# Patient Record
Sex: Female | Born: 1955
Health system: Southern US, Community
[De-identification: ages and names within clinical notes are randomized; demographics above are authoritative.]

## PROBLEM LIST (undated history)

## (undated) DIAGNOSIS — H269 Unspecified cataract: Secondary | ICD-10-CM

## (undated) DIAGNOSIS — N189 Chronic kidney disease, unspecified: Secondary | ICD-10-CM

## (undated) DIAGNOSIS — T7840XA Allergy, unspecified, initial encounter: Secondary | ICD-10-CM

## (undated) DIAGNOSIS — I10 Essential (primary) hypertension: Secondary | ICD-10-CM

## (undated) DIAGNOSIS — D649 Anemia, unspecified: Secondary | ICD-10-CM

## (undated) DIAGNOSIS — E559 Vitamin D deficiency, unspecified: Secondary | ICD-10-CM

## (undated) DIAGNOSIS — M1990 Unspecified osteoarthritis, unspecified site: Secondary | ICD-10-CM

## (undated) DIAGNOSIS — M8580 Other specified disorders of bone density and structure, unspecified site: Secondary | ICD-10-CM

## (undated) DIAGNOSIS — N3090 Cystitis, unspecified without hematuria: Secondary | ICD-10-CM

## (undated) DIAGNOSIS — M810 Age-related osteoporosis without current pathological fracture: Secondary | ICD-10-CM

## (undated) DIAGNOSIS — G4700 Insomnia, unspecified: Secondary | ICD-10-CM

## (undated) HISTORY — DX: Unspecified cataract: H26.9

## (undated) HISTORY — DX: Allergy, unspecified, initial encounter: T78.40XA

## (undated) HISTORY — DX: Anemia, unspecified: D64.9

## (undated) HISTORY — DX: Insomnia, unspecified: G47.00

## (undated) HISTORY — DX: Other specified disorders of bone density and structure, unspecified site: M85.80

## (undated) HISTORY — PX: LITHOTRIPSY: SUR834

## (undated) HISTORY — PX: CATARACT EXTRACTION, BILATERAL: SHX1313

## (undated) HISTORY — DX: Age-related osteoporosis without current pathological fracture: M81.0

## (undated) HISTORY — DX: Chronic kidney disease, unspecified: N18.9

## (undated) HISTORY — DX: Unspecified osteoarthritis, unspecified site: M19.90

## (undated) HISTORY — DX: Cystitis, unspecified without hematuria: N30.90

## (undated) HISTORY — PX: COLONOSCOPY: SHX174

## (undated) HISTORY — DX: Essential (primary) hypertension: I10

## (undated) HISTORY — PX: TOE SURGERY: SHX1073

## (undated) HISTORY — DX: Vitamin D deficiency, unspecified: E55.9

---

## 1998-11-30 ENCOUNTER — Other Ambulatory Visit: Admission: RE | Admit: 1998-11-30 | Discharge: 1998-11-30 | Payer: Self-pay | Admitting: *Deleted

## 1999-12-21 ENCOUNTER — Other Ambulatory Visit: Admission: RE | Admit: 1999-12-21 | Discharge: 1999-12-21 | Payer: Self-pay | Admitting: *Deleted

## 2001-08-21 ENCOUNTER — Other Ambulatory Visit: Admission: RE | Admit: 2001-08-21 | Discharge: 2001-08-21 | Payer: Self-pay | Admitting: Obstetrics & Gynecology

## 2002-10-12 ENCOUNTER — Other Ambulatory Visit: Admission: RE | Admit: 2002-10-12 | Discharge: 2002-10-12 | Payer: Self-pay | Admitting: Obstetrics & Gynecology

## 2008-02-18 ENCOUNTER — Ambulatory Visit: Payer: Self-pay | Admitting: Vascular Surgery

## 2008-04-06 ENCOUNTER — Ambulatory Visit: Payer: Self-pay | Admitting: Vascular Surgery

## 2008-05-20 ENCOUNTER — Ambulatory Visit: Payer: Self-pay | Admitting: Vascular Surgery

## 2008-06-29 ENCOUNTER — Ambulatory Visit: Payer: Self-pay | Admitting: Vascular Surgery

## 2008-07-06 ENCOUNTER — Ambulatory Visit: Payer: Self-pay | Admitting: Vascular Surgery

## 2010-07-17 NOTE — Consult Note (Signed)
NEW PATIENT CONSULTATION   Nicole Fields, Nicole Fields  DOB:  05-29-1955                                       02/18/2008  [id number]#:[id number]   The patient presents today for evaluation of bilateral venous pathology.  She is a very pleasant 55 year old white female with a long history of  spider vein telangiectasia and now with varicose veins in her left  posterior calf.  She works long shifts with production work and reports  that this causes a burning, aching and stinging sensation in her  posterior calf with prolonged standing.  She does not have any history  of deep venous thrombosis, superficial thrombophlebitis or bleeding.   PAST HISTORY:  Negative for:  1. Diabetes.  2. Hypertension.  3. Cardiac disease.   SOCIAL HISTORY:  She is married with 1 child.  She does not smoke,  having quit 9 years ago.  She does not drink alcohol.   Her weight is reported at 135 pounds, she is 5 feet 7 inches tall.   REVIEW OF SYSTEMS:  Completely negative.   ALLERGIES:  None.   MEDICATIONS:  Birth control pill and Vitamin D.   PHYSICAL EXAMINATION:  A well-developed, well-nourished white female  appearing stated age of 13.  Blood pressure is 132/86, pulse 72,  respirations 18.  Radial pulses are 2+ and she has 2+ dorsalis pedis  pulses bilaterally.  Her left posterior calf is noted for tributary  varicosities in her posterior calf.  She underwent a screening duplex by  me and this did reveal reflux in her small saphenous vein extending into  these varicosities.  Her great saphenous vein appears to be normal  bilaterally.  She does have multiple spider vein telangiectasia  bilaterally, there are several patches that are more concerning to her  from an appearance standpoint over her right lateral knee area and also  at the medial knee area.   I had a long discussion with the patient explaining the significance of  her varicosities related to her small saphenous vein incompetence on  the  left.  We have fitted her for thigh-high graduated compression stockings  20-30 mmHg today and instructed her on their daily use for relief of  symptoms from her venous hypertension in her right posterior calf.  She  also will elevate when possible and is instructed to use ibuprofen 600  mg three times daily for the discomfort as well.  I explained that the  telangiectasia could be treated with sclerotherapy if they are a concern  from an appearance standpoint.  We plan to see her again in 3 months to  determine if she is having improvement in her left calf symptoms.  She  will notify us should she wish to proceed with sclerotherapy for  treatment of the other telangiectasia.   Larina Earthly, M.D.  Electronically Signed   TFE/MEDQ  D:  02/18/2008  T:  02/18/2008  Job:  [id number]   cc:   Cristy Friedlander, Dr.

## 2010-07-17 NOTE — Assessment & Plan Note (Signed)
OFFICE VISIT   PORSCHE, NOGUCHI  DOB:  28-Mar-1955                                       04/06/2008  [id number]#:[id number]   The patient presents today for sclerotherapy of spider vein  telangiectasia over her right thigh.  She does have reflux in her left  small saphenous vein and has tributary varicosities which are causing  her pain.  She is undergoing compression garment therapy currently for  this.  She has not noted any particular improvement in her left leg.  She does have several prominent patches of telangiectasia over her right  anterior thigh and wishes cosmetic treatment of these.  She had a total  of 2 mL of 0.3% sodium tetradecanol and 2 mL of CO2 gas for  sclerotherapy of these areas over her anterior thigh.  We will see her  again in mid-March for follow-up of her left leg and also for follow-up  of the sclerotherapy.   Larina Earthly, M.D.  Electronically Signed   TFE/MEDQ  D:  04/06/2008  T:  04/07/2008  Job:  [id number]

## 2010-07-17 NOTE — Assessment & Plan Note (Signed)
OFFICE VISIT   Nicole, Fields  DOB:  Dec 29, 1955                                       05/20/2008  [id number]#:[id number]   The patient presents today for continued followup of her left leg venous  hypertension.  She had undergone some sclerotherapy for spider vein  telangiectasia over her right lateral thigh and has had improvement  related to this.  She continues to have discomfort related to her left  calf.  She reports that she has pain over the varicosities in the left  calf and that this is particularly difficult since she works 2 hour  shifts and stands throughout the shift which is quite difficult  secondary to leg pain and swelling.  She is unable to walk for exercise  secondary to pain and swelling and has a very difficult time with car  travel and airplanes due to leg pain and swelling with very long  sitting.   PHYSICAL EXAMINATION:  Unchanged.  She does have tributary varicosities  in her left posterior calf.  She underwent formal duplex today.  This  shows no evidence of deep venous reflux or DVT on the left.  She does  have incompetence in her small saphenous vein extending into the  varicosities on her left calf.   She has worn compression garments and has reported no improvement of her  pain.  This does interfere with her activities and she wished to proceed  with ablation of her small saphenous vein and stab phlebectomy of the  tributary varicosities of her posterior calf.  We will schedule this at  her earliest convenience.   Nicole Fields, M.D.  Electronically Signed   TFE/MEDQ  D:  05/20/2008  T:  05/20/2008  Job:  [id number]

## 2010-07-17 NOTE — Procedures (Signed)
LOWER EXTREMITY VENOUS REFLUX EXAM   INDICATION:  Reflux in the left short saphenous vein.  The right leg was  treated for telangiectasia on April 06, 2008.   EXAM:  Using color-flow imaging and pulse Doppler spectral analysis, the  left common femoral, superficial femoral, popliteal, posterior tibial,  greater and lesser saphenous veins are evaluated.  There is no evidence  suggesting deep venous insufficiency in the left lower extremity.   The right and left saphenofemoral junctions are competent.  The right  and left GSVs are competent.   The left proximal short saphenous vein demonstrates incompetency.   Diameter measurements are on the affixed page.   GSV Diameter (used if found to be incompetent only)                                            Right    Left  Proximal Greater Saphenous Vein           cm       cm  Proximal-to-mid-thigh                     cm       cm  Mid thigh                                 cm       cm  Mid-distal thigh                          cm       cm  Distal thigh                              cm       cm  Knee                                      cm       cm   IMPRESSION:  1. Right and left greater saphenous veins are identified and appear to      be competent.  2. The left greater saphenous vein is not aneurysmal.  3. The left greater saphenous vein is not tortuous.  4. The deep venous system is competent.  5. The left short saphenous vein is not competent.  There is a 6.6 cm      length of short saphenous vein from the saphenopopliteal junction      to the first branch which is incompetent.  Five cm distally on the      branch, which wraps to the lateral aspect of the leg, incompetence      is also noted.  Distal to this point at the mid calf the      incompetent vein subdivides into multiple small branches.  The      midportion of the short saphenous vein has a diameter of 0.21 cm      and 0.17 cm and is competent.      ___________________________________________  Larina Earthly, M.D.   MC/MEDQ  D:  05/20/2008  T:  05/20/2008  Job:  [id number]

## 2010-07-17 NOTE — Procedures (Signed)
DUPLEX DEEP VENOUS EXAM - LOWER EXTREMITY   INDICATION:  Evaluation, status post ablation of left short saphenous  vein  on June 29, 2008.   HISTORY:  Edema:  No.  Trauma/Surgery:  Laser ablation and stab phlebectomies on 06/11/08, left  leg.  Pain:  Left calf.  PE:  No.  Previous DVT:  No.  Anticoagulants:  No.  Other:   DUPLEX EXAM:                CFV   SFV   PopV  PTV    GSV                R  L  R  L  R  L  R   L  R  L  Thrombosis    o  o     o     o      o     o  Spontaneous   +  +     +     +      +     +  Phasic        +  +     +     +      +     +  Augmentation  +  +     +     +      +     +  Compressible  +  +     +     +      +     +  Competent     +  +     +     +      +     +   Legend:  + - yes  o - no  p - partial  D - decreased   IMPRESSION:  1. No evidence of left leg deep venous thrombosis.  2. Left short saphenous vein  is thrombosed from the saphenopopliteal      junction through the calf.  No evidence of thrombus seen within the      left popliteal vein.    _____________________________  Larina Earthly, M.D.   MC/MEDQ  D:  07/06/2008  T:  07/06/2008  Job:  [id number]

## 2010-07-17 NOTE — Assessment & Plan Note (Signed)
OFFICE VISIT   PAULYNE, MOOTY  DOB:  February 16, 1956                                       07/06/2008  EAVWU#:[id number]   The patient presents today 1 week followup laser ablation of her left  small saphenous vein and stab phlebectomy of multiple tributaries in her  posterior calf.  She had no immediate complication.  She has minimal  discomfort related to this.   On physical examination she has mild bruising in the phlebectomy area.  She underwent venous duplex and this shows ablation of her small  saphenous vein from the mid calf up to the saphenopopliteal junction and  no evidence of DVT.  I am quite pleased with her initial result as is  the patient.  She will continue her compression garment use for 1 more  additional week and will see [place] again on an as needed basis.   Larina Earthly, M.D.  Electronically Signed   TFE/MEDQ  D:  07/06/2008  T:  07/07/2008  Job:  [id number]   cc:   Dr Cristy Friedlander

## 2016-01-29 ENCOUNTER — Encounter: Payer: Self-pay | Admitting: Obstetrics & Gynecology

## 2016-01-29 DIAGNOSIS — Z1151 Encounter for screening for human papillomavirus (HPV): Secondary | ICD-10-CM | POA: Diagnosis not present

## 2016-01-29 DIAGNOSIS — Z1231 Encounter for screening mammogram for malignant neoplasm of breast: Secondary | ICD-10-CM | POA: Diagnosis not present

## 2016-01-29 DIAGNOSIS — Z01419 Encounter for gynecological examination (general) (routine) without abnormal findings: Secondary | ICD-10-CM | POA: Diagnosis not present

## 2016-01-29 DIAGNOSIS — Z6824 Body mass index (BMI) 24.0-24.9, adult: Secondary | ICD-10-CM | POA: Diagnosis not present

## 2016-02-29 DIAGNOSIS — H25819 Combined forms of age-related cataract, unspecified eye: Secondary | ICD-10-CM | POA: Diagnosis not present

## 2016-02-29 DIAGNOSIS — H25813 Combined forms of age-related cataract, bilateral: Secondary | ICD-10-CM | POA: Diagnosis not present

## 2016-02-29 DIAGNOSIS — H25811 Combined forms of age-related cataract, right eye: Secondary | ICD-10-CM | POA: Diagnosis not present

## 2016-05-30 DIAGNOSIS — H25812 Combined forms of age-related cataract, left eye: Secondary | ICD-10-CM | POA: Diagnosis not present

## 2016-11-20 DIAGNOSIS — I10 Essential (primary) hypertension: Secondary | ICD-10-CM | POA: Diagnosis not present

## 2016-11-20 DIAGNOSIS — Z0000 Encounter for general adult medical examination without abnormal findings: Secondary | ICD-10-CM | POA: Diagnosis not present

## 2016-11-20 DIAGNOSIS — Z23 Encounter for immunization: Secondary | ICD-10-CM | POA: Diagnosis not present

## 2016-11-20 DIAGNOSIS — F419 Anxiety disorder, unspecified: Secondary | ICD-10-CM | POA: Diagnosis not present

## 2016-12-11 DIAGNOSIS — Z6824 Body mass index (BMI) 24.0-24.9, adult: Secondary | ICD-10-CM | POA: Diagnosis not present

## 2016-12-11 DIAGNOSIS — Z23 Encounter for immunization: Secondary | ICD-10-CM | POA: Diagnosis not present

## 2016-12-11 DIAGNOSIS — I10 Essential (primary) hypertension: Secondary | ICD-10-CM | POA: Diagnosis not present

## 2016-12-11 DIAGNOSIS — Z87448 Personal history of other diseases of urinary system: Secondary | ICD-10-CM | POA: Diagnosis not present

## 2016-12-18 DIAGNOSIS — S6992XA Unspecified injury of left wrist, hand and finger(s), initial encounter: Secondary | ICD-10-CM | POA: Diagnosis not present

## 2016-12-18 DIAGNOSIS — M7989 Other specified soft tissue disorders: Secondary | ICD-10-CM | POA: Diagnosis not present

## 2016-12-18 DIAGNOSIS — S63502A Unspecified sprain of left wrist, initial encounter: Secondary | ICD-10-CM | POA: Diagnosis not present

## 2016-12-18 DIAGNOSIS — M79602 Pain in left arm: Secondary | ICD-10-CM | POA: Diagnosis not present

## 2016-12-18 DIAGNOSIS — W19XXXA Unspecified fall, initial encounter: Secondary | ICD-10-CM | POA: Diagnosis not present

## 2017-01-01 DIAGNOSIS — Z87448 Personal history of other diseases of urinary system: Secondary | ICD-10-CM | POA: Diagnosis not present

## 2017-01-03 DIAGNOSIS — R319 Hematuria, unspecified: Secondary | ICD-10-CM | POA: Diagnosis not present

## 2017-01-03 DIAGNOSIS — Z79899 Other long term (current) drug therapy: Secondary | ICD-10-CM | POA: Diagnosis not present

## 2017-01-15 DIAGNOSIS — R3129 Other microscopic hematuria: Secondary | ICD-10-CM | POA: Diagnosis not present

## 2017-01-15 DIAGNOSIS — N200 Calculus of kidney: Secondary | ICD-10-CM | POA: Diagnosis not present

## 2017-01-22 DIAGNOSIS — R310 Gross hematuria: Secondary | ICD-10-CM | POA: Diagnosis not present

## 2017-01-22 DIAGNOSIS — N200 Calculus of kidney: Secondary | ICD-10-CM | POA: Diagnosis not present

## 2017-01-29 DIAGNOSIS — N200 Calculus of kidney: Secondary | ICD-10-CM | POA: Diagnosis not present

## 2017-02-14 DIAGNOSIS — Z87891 Personal history of nicotine dependence: Secondary | ICD-10-CM | POA: Diagnosis not present

## 2017-02-14 DIAGNOSIS — N200 Calculus of kidney: Secondary | ICD-10-CM | POA: Diagnosis not present

## 2017-02-14 DIAGNOSIS — Z79899 Other long term (current) drug therapy: Secondary | ICD-10-CM | POA: Diagnosis not present

## 2017-02-14 DIAGNOSIS — I10 Essential (primary) hypertension: Secondary | ICD-10-CM | POA: Diagnosis not present

## 2017-02-14 DIAGNOSIS — F419 Anxiety disorder, unspecified: Secondary | ICD-10-CM | POA: Diagnosis not present

## 2017-02-20 DIAGNOSIS — L814 Other melanin hyperpigmentation: Secondary | ICD-10-CM | POA: Diagnosis not present

## 2017-02-20 DIAGNOSIS — L821 Other seborrheic keratosis: Secondary | ICD-10-CM | POA: Diagnosis not present

## 2017-02-20 DIAGNOSIS — D2239 Melanocytic nevi of other parts of face: Secondary | ICD-10-CM | POA: Diagnosis not present

## 2017-02-20 DIAGNOSIS — L570 Actinic keratosis: Secondary | ICD-10-CM | POA: Diagnosis not present

## 2017-02-20 DIAGNOSIS — D225 Melanocytic nevi of trunk: Secondary | ICD-10-CM | POA: Diagnosis not present

## 2017-02-27 DIAGNOSIS — N200 Calculus of kidney: Secondary | ICD-10-CM | POA: Diagnosis not present

## 2017-03-13 DIAGNOSIS — E538 Deficiency of other specified B group vitamins: Secondary | ICD-10-CM | POA: Diagnosis not present

## 2017-03-13 DIAGNOSIS — I10 Essential (primary) hypertension: Secondary | ICD-10-CM | POA: Diagnosis not present

## 2017-03-13 DIAGNOSIS — N200 Calculus of kidney: Secondary | ICD-10-CM | POA: Diagnosis not present

## 2017-03-13 DIAGNOSIS — F419 Anxiety disorder, unspecified: Secondary | ICD-10-CM | POA: Diagnosis not present

## 2017-04-09 DIAGNOSIS — N200 Calculus of kidney: Secondary | ICD-10-CM | POA: Diagnosis not present

## 2017-04-10 DIAGNOSIS — N200 Calculus of kidney: Secondary | ICD-10-CM | POA: Diagnosis not present

## 2017-06-03 ENCOUNTER — Telehealth: Payer: Self-pay | Admitting: *Deleted

## 2017-06-03 NOTE — Telephone Encounter (Signed)
Please tell her to use nothing.  I will use a small speculum with water and the result should be fine without preparing with Estradiol.

## 2017-06-03 NOTE — Telephone Encounter (Signed)
Patient called in triage voice mail stating that nurse called her back today but she has bad connection at work. She is home now and asked [place] to call her back. I called her back and got voice mail. Left message to call.

## 2017-06-03 NOTE — Telephone Encounter (Signed)
(  paper chart on your desk) Patient has annual scheduled on 09/16/17, states you typically prescribe estrace vaginal cream 0.1% for her use prior to annual exam. Pt said the insurance won't pay for Rx and it is expensive to pay out of pocket and she doesn't use the whole tube of medication. She asked if you have and idea of something else she could use? Please advise

## 2017-06-17 NOTE — Telephone Encounter (Signed)
Left message pt to call.

## 2017-06-23 NOTE — Telephone Encounter (Signed)
Nicole Fields informed pt.

## 2017-09-09 ENCOUNTER — Encounter: Payer: Self-pay | Admitting: Obstetrics & Gynecology

## 2017-09-09 ENCOUNTER — Ambulatory Visit (INDEPENDENT_AMBULATORY_CARE_PROVIDER_SITE_OTHER): Payer: BLUE CROSS/BLUE SHIELD | Admitting: Obstetrics & Gynecology

## 2017-09-09 VITALS — BP 118/80 | Ht 66.0 in | Wt 152.0 lb

## 2017-09-09 DIAGNOSIS — Z780 Asymptomatic menopausal state: Secondary | ICD-10-CM | POA: Diagnosis not present

## 2017-09-09 DIAGNOSIS — Z01419 Encounter for gynecological examination (general) (routine) without abnormal findings: Secondary | ICD-10-CM

## 2017-09-09 NOTE — Patient Instructions (Signed)
1. Encounter for routine gynecological examination with Papanicolaou smear of cervix Normal gynecologic exam and menopause.  Pap with high-risk HPV done today.  Breast exam normal.  Will schedule screening mammogram now.  Health labs with family physician.  Colonoscopy in 2015.  2. Menopause present Well on no hormone replacement therapy.  No postmenopausal bleeding.  Last bone density in 2014.  Will organize bone density in  through her family physician.  Recommend vitamin D supplements, calcium rich nutrition and regular weightbearing physical activity.  Nicole Fields, it was a pleasure seeing you today!  I will inform you of your results as soon as they are available.

## 2017-09-09 NOTE — Progress Notes (Signed)
Nicole Fields 1955-05-10 [id number]   History:    62 y.o. G1P1L1 Married.  Daughter got married.  RP:  Established patient presenting for annual gyn exam   HPI:  Menopause, well on no HRT.  No PMB.  No pelvic pain.  No pain with IC.  Normal vaginal secretions.  Urine/BMs wnl.  Breasts normal.  BMI 24.53.  Fit/healthy nutrition.  cHTN on Losartan.  Health Labs with Fam MD.  [name] 2015.  Past medical history,surgical history, family history and social history were all reviewed and documented in the EPIC chart.  Gynecologic History No LMP recorded. Patient is postmenopausal. Contraception: post menopausal status Last Pap: 01/2016. Results were: Negative/HPV HR neg Last mammogram: 01/2016. Results were: Negative Bone Density: 06/2012.  Will schedule in [place] Colonoscopy: 10/2013  Obstetric History OB History  Gravida Para Term Preterm AB Living  1 1     0 1  SAB TAB Ectopic Multiple Live Births      0        # Outcome Date GA Lbr Len/2nd Weight Sex Delivery Anes PTL Lv  1 Para              ROS: A ROS was performed and pertinent positives and negatives are included in the history.  GENERAL: No fevers or chills. HEENT: No change in vision, no earache, sore throat or sinus congestion. NECK: No pain or stiffness. CARDIOVASCULAR: No chest pain or pressure. No palpitations. PULMONARY: No shortness of breath, cough or wheeze. GASTROINTESTINAL: No abdominal pain, nausea, vomiting or diarrhea, melena or bright red blood per rectum. GENITOURINARY: No urinary frequency, urgency, hesitancy or dysuria. MUSCULOSKELETAL: No joint or muscle pain, no back pain, no recent trauma. DERMATOLOGIC: No rash, no itching, no lesions. ENDOCRINE: No polyuria, polydipsia, no heat or cold intolerance. No recent change in weight. HEMATOLOGICAL: No anemia or easy bruising or bleeding. NEUROLOGIC: No headache, seizures, numbness, tingling or weakness. PSYCHIATRIC: No depression, no loss of interest in normal  activity or change in sleep pattern.     Exam:   BP 118/80 (BP Location: Right Arm, Patient Position: Sitting, Cuff Size: Normal)   Ht 5\' 6"  (1.676 m)   Wt 152 lb (68.9 kg)   BMI 24.53 kg/m   Body mass index is 24.53 kg/m.  General appearance : Well developed well nourished female. No acute distress HEENT: Eyes: no retinal hemorrhage or exudates,  Neck supple, trachea midline, no carotid bruits, no thyroidmegaly Lungs: Clear to auscultation, no rhonchi or wheezes, or rib retractions  Heart: Regular rate and rhythm, no murmurs or gallops Breast:Examined in sitting and supine position were symmetrical in appearance, no palpable masses or tenderness,  no skin retraction, no nipple inversion, no nipple discharge, no skin discoloration, no axillary or supraclavicular lymphadenopathy Abdomen: no palpable masses or tenderness, no rebound or guarding Extremities: no edema or skin discoloration or tenderness  Pelvic: Vulva: Normal             Vagina: No gross lesions or discharge  Cervix: No gross lesions or discharge.  Pap/HPV HR done  Uterus  AV, normal size, shape and consistency, non-tender and mobile  Adnexa  Without masses or tenderness  Anus: Normal   Assessment/Plan:  62 y.o. female for annual exam   1. Encounter for routine gynecological examination with Papanicolaou smear of cervix Normal gynecologic exam and menopause.  Pap with high-risk HPV done today.  Breast exam normal.  Will schedule screening mammogram now.  Health labs with family physician.  Colonoscopy in 2015.  2. Menopause present Well on no hormone replacement therapy.  No postmenopausal bleeding.  Last bone density in 2014.  Will organize bone density in [place] through her family physician.  Recommend vitamin D supplements, calcium rich nutrition and regular weightbearing physical activity.  [name] MD, 4:04 PM 09/09/2017

## 2017-09-10 LAB — PAP, TP IMAGING W/ HPV RNA, RFLX HPV TYPE 16,18/45: HPV DNA High Risk: NOT DETECTED

## 2017-09-11 DIAGNOSIS — F5101 Primary insomnia: Secondary | ICD-10-CM | POA: Diagnosis not present

## 2017-09-11 DIAGNOSIS — M810 Age-related osteoporosis without current pathological fracture: Secondary | ICD-10-CM | POA: Diagnosis not present

## 2017-09-11 DIAGNOSIS — E559 Vitamin D deficiency, unspecified: Secondary | ICD-10-CM | POA: Diagnosis not present

## 2017-09-11 DIAGNOSIS — I10 Essential (primary) hypertension: Secondary | ICD-10-CM | POA: Diagnosis not present

## 2017-09-11 DIAGNOSIS — E538 Deficiency of other specified B group vitamins: Secondary | ICD-10-CM | POA: Diagnosis not present

## 2017-09-16 ENCOUNTER — Encounter: Payer: Self-pay | Admitting: Obstetrics & Gynecology

## 2017-10-08 DIAGNOSIS — R1031 Right lower quadrant pain: Secondary | ICD-10-CM | POA: Diagnosis not present

## 2017-10-08 DIAGNOSIS — N200 Calculus of kidney: Secondary | ICD-10-CM | POA: Diagnosis not present

## 2017-10-13 DIAGNOSIS — M8589 Other specified disorders of bone density and structure, multiple sites: Secondary | ICD-10-CM | POA: Diagnosis not present

## 2017-10-13 DIAGNOSIS — M810 Age-related osteoporosis without current pathological fracture: Secondary | ICD-10-CM | POA: Diagnosis not present

## 2017-10-24 DIAGNOSIS — Z1231 Encounter for screening mammogram for malignant neoplasm of breast: Secondary | ICD-10-CM | POA: Diagnosis not present

## 2018-03-03 DIAGNOSIS — L820 Inflamed seborrheic keratosis: Secondary | ICD-10-CM | POA: Diagnosis not present

## 2018-03-03 DIAGNOSIS — L578 Other skin changes due to chronic exposure to nonionizing radiation: Secondary | ICD-10-CM | POA: Diagnosis not present

## 2018-03-16 DIAGNOSIS — Z79899 Other long term (current) drug therapy: Secondary | ICD-10-CM | POA: Diagnosis not present

## 2018-03-16 DIAGNOSIS — Z23 Encounter for immunization: Secondary | ICD-10-CM | POA: Diagnosis not present

## 2018-03-16 DIAGNOSIS — I10 Essential (primary) hypertension: Secondary | ICD-10-CM | POA: Diagnosis not present

## 2018-03-16 DIAGNOSIS — F5101 Primary insomnia: Secondary | ICD-10-CM | POA: Diagnosis not present

## 2018-09-10 ENCOUNTER — Other Ambulatory Visit: Payer: Self-pay

## 2018-09-11 ENCOUNTER — Ambulatory Visit (INDEPENDENT_AMBULATORY_CARE_PROVIDER_SITE_OTHER): Payer: BC Managed Care – PPO | Admitting: Obstetrics & Gynecology

## 2018-09-11 ENCOUNTER — Encounter: Payer: Self-pay | Admitting: Obstetrics & Gynecology

## 2018-09-11 VITALS — BP 126/84 | Ht 65.5 in | Wt 151.0 lb

## 2018-09-11 DIAGNOSIS — Z01419 Encounter for gynecological examination (general) (routine) without abnormal findings: Secondary | ICD-10-CM | POA: Diagnosis not present

## 2018-09-11 DIAGNOSIS — Z780 Asymptomatic menopausal state: Secondary | ICD-10-CM

## 2018-09-11 NOTE — Progress Notes (Signed)
Nicole Fields 1955/10/08 [id number]   History:    63 y.o. G1P1L1 Married  RP:  Established patient presenting for annual gyn exam   HPI: Menoapause, well on no HRT. No PMB.  No pelvic pain.  No pain with IC.  Normal vaginal secretions.  Urine/BMs wnl.  Breasts normal.  BMI 24.75.  Fit/healthy nutrition.  cHTN on Losartan.  Health Labs with Fam MD.  [name] 2015.   Past medical history,surgical history, family history and social history were all reviewed and documented in the EPIC chart.  Gynecologic History No LMP recorded. Patient is postmenopausal. Contraception: post menopausal status Last Pap: 09/2017. Results were: Negative/HPV HR neg Last mammogram: 2019. Results were: normal Bone Density: Never Colonoscopy: 2015  Obstetric History OB History  Gravida Para Term Preterm AB Living  1 1     0 1  SAB TAB Ectopic Multiple Live Births      0        # Outcome Date GA Lbr Len/2nd Weight Sex Delivery Anes PTL Lv  1 Para              ROS: A ROS was performed and pertinent positives and negatives are included in the history.  GENERAL: No fevers or chills. HEENT: No change in vision, no earache, sore throat or sinus congestion. NECK: No pain or stiffness. CARDIOVASCULAR: No chest pain or pressure. No palpitations. PULMONARY: No shortness of breath, cough or wheeze. GASTROINTESTINAL: No abdominal pain, nausea, vomiting or diarrhea, melena or bright red blood per rectum. GENITOURINARY: No urinary frequency, urgency, hesitancy or dysuria. MUSCULOSKELETAL: No joint or muscle pain, no back pain, no recent trauma. DERMATOLOGIC: No rash, no itching, no lesions. ENDOCRINE: No polyuria, polydipsia, no heat or cold intolerance. No recent change in weight. HEMATOLOGICAL: No anemia or easy bruising or bleeding. NEUROLOGIC: No headache, seizures, numbness, tingling or weakness. PSYCHIATRIC: No depression, no loss of interest in normal activity or change in sleep pattern.     Exam:   BP 126/84    Ht 5' 5.5" (1.664 m)   Wt 151 lb (68.5 kg)   BMI 24.75 kg/m   Body mass index is 24.75 kg/m.  General appearance : Well developed well nourished female. No acute distress HEENT: Eyes: no retinal hemorrhage or exudates,  Neck supple, trachea midline, no carotid bruits, no thyroidmegaly Lungs: Clear to auscultation, no rhonchi or wheezes, or rib retractions  Heart: Regular rate and rhythm, no murmurs or gallops Breast:Examined in sitting and supine position were symmetrical in appearance, no palpable masses or tenderness,  no skin retraction, no nipple inversion, no nipple discharge, no skin discoloration, no axillary or supraclavicular lymphadenopathy Abdomen: no palpable masses or tenderness, no rebound or guarding Extremities: no edema or skin discoloration or tenderness  Pelvic: Vulva: Normal             Vagina: No gross lesions or discharge  Cervix: No gross lesions or discharge.  Pap reflex done.  Uterus  AV, normal size, shape and consistency, non-tender and mobile  Adnexa  Without masses or tenderness  Anus: Normal   Assessment/Plan:  63 y.o. female for annual exam   1. Encounter for gynecological examination with Papanicolaou smear of cervix Normal gynecologic exam.  Pap reflex done.  Breast exam normal.  Will obtain report from recent screening mammogram.  Colonoscopy in 2015.  Health labs with family physician.  Good body mass index at 24.75.  Continue with fitness and healthy nutrition. - Pap IG w/ reflex to  HPV when ASC-U  2. Postmenopause Well on no hormone replacement therapy.  No postmenopausal bleeding.  Recommend vitamin D supplements, calcium intake of 1200 mg daily and regular weightbearing physical activities.  If no bone density done so far, recommend scheduling one now.  Other orders - magnesium 30 MG tablet; Take 30 mg by mouth 2 (two) times daily.  [name] MD, 4:38 PM 09/11/2018

## 2018-09-14 DIAGNOSIS — Z01419 Encounter for gynecological examination (general) (routine) without abnormal findings: Secondary | ICD-10-CM | POA: Diagnosis not present

## 2018-09-15 LAB — PAP IG W/ RFLX HPV ASCU

## 2018-09-17 DIAGNOSIS — Z23 Encounter for immunization: Secondary | ICD-10-CM | POA: Diagnosis not present

## 2018-09-17 DIAGNOSIS — E559 Vitamin D deficiency, unspecified: Secondary | ICD-10-CM | POA: Diagnosis not present

## 2018-09-17 DIAGNOSIS — E538 Deficiency of other specified B group vitamins: Secondary | ICD-10-CM | POA: Diagnosis not present

## 2018-09-17 DIAGNOSIS — Z0000 Encounter for general adult medical examination without abnormal findings: Secondary | ICD-10-CM | POA: Diagnosis not present

## 2018-09-17 DIAGNOSIS — I10 Essential (primary) hypertension: Secondary | ICD-10-CM | POA: Diagnosis not present

## 2018-09-18 ENCOUNTER — Encounter: Payer: Self-pay | Admitting: Obstetrics & Gynecology

## 2018-09-18 NOTE — Patient Instructions (Signed)
1. Encounter for gynecological examination with Papanicolaou smear of cervix Normal gynecologic exam.  Pap reflex done.  Breast exam normal.  Will obtain report from recent screening mammogram.  Colonoscopy in 2015.  Health labs with family physician.  Good body mass index at 24.75.  Continue with fitness and healthy nutrition. - Pap IG w/ reflex to HPV when ASC-U  2. Postmenopause Well on no hormone replacement therapy.  No postmenopausal bleeding.  Recommend vitamin D supplements, calcium intake of 1200 mg daily and regular weightbearing physical activities.  If no bone density done so far, recommend scheduling one now.  Other orders - magnesium 30 MG tablet; Take 30 mg by mouth 2 (two) times daily.  Nicole Fields, it was a pleasure seeing you today!  I will inform you of your results as soon as they are available.

## 2018-09-22 ENCOUNTER — Telehealth: Payer: Self-pay | Admitting: *Deleted

## 2018-09-22 NOTE — Telephone Encounter (Signed)
Left message for pt to call.

## 2018-09-22 NOTE — Telephone Encounter (Signed)
-----   Message from [name], MD sent at 09/18/2018 10:01 AM EDT ----- Regarding: Bone Density Recommend a Bone Density.  Please call patient and verify if she has done one before.  If not or >2 yrs, can schedule here with [place] at her convenience.

## 2018-09-23 NOTE — Telephone Encounter (Signed)
Spoke with patient and she reports having 2 dexa's, 1 being at [place] office, and the other [facility].  Patient will check when last dexa was done and let me know if order needed.

## 2018-10-14 ENCOUNTER — Encounter: Payer: Self-pay | Admitting: Gastroenterology

## 2018-10-29 ENCOUNTER — Other Ambulatory Visit: Payer: Self-pay | Admitting: Family Medicine

## 2018-10-29 DIAGNOSIS — Z1231 Encounter for screening mammogram for malignant neoplasm of breast: Secondary | ICD-10-CM

## 2018-11-03 ENCOUNTER — Ambulatory Visit (AMBULATORY_SURGERY_CENTER): Payer: BC Managed Care – PPO | Admitting: *Deleted

## 2018-11-03 ENCOUNTER — Ambulatory Visit
Admission: RE | Admit: 2018-11-03 | Discharge: 2018-11-03 | Disposition: A | Discharge status: Home / Self Care | Payer: BC Managed Care – PPO | Source: Ambulatory Visit | Attending: Family Medicine | Admitting: Family Medicine

## 2018-11-03 ENCOUNTER — Other Ambulatory Visit: Payer: Self-pay

## 2018-11-03 VITALS — Temp 97.1°F | Ht 66.0 in | Wt 150.0 lb

## 2018-11-03 DIAGNOSIS — Z8371 Family history of colonic polyps: Secondary | ICD-10-CM

## 2018-11-03 DIAGNOSIS — Z1231 Encounter for screening mammogram for malignant neoplasm of breast: Secondary | ICD-10-CM

## 2018-11-03 MED ORDER — SUPREP BOWEL PREP KIT 17.5-3.13-1.6 GM/177ML PO SOLN: 1.0000 | Freq: Once | ORAL | 0 refills | Status: AC

## 2018-11-03 NOTE — Progress Notes (Signed)
No egg or soy allergy known to patient  No issues with past sedation with any surgeries  or procedures, no intubation problems  No diet pills per patient No home 02 use per patient  No blood thinners per patient  Pt denies issues with constipation - occ but normally not an issue  On magnesium  No A fib or A flutter  EMMI video sent to pt's e mail   Suprep $15 coupon to pt-

## 2018-11-05 ENCOUNTER — Encounter: Payer: Self-pay | Admitting: Gastroenterology

## 2018-11-16 ENCOUNTER — Telehealth: Payer: Self-pay | Admitting: Gastroenterology

## 2018-11-16 NOTE — Telephone Encounter (Signed)
Hi Dr, Lyndel Safe, this pt just cancelled her procedure with you for tomorrow due to insurance coverage, she apologized and said that she will call to reschedule next year.

## 2018-11-16 NOTE — Telephone Encounter (Signed)
Do you now or have you had a fever in the last 14 days?         Do you have any respiratory symptoms of shortness of breath or cough now or in the last 14 days?        Do you have any family members or close contacts with diagnosed or suspected Covid-19 in the past 14 days?         Have you been tested for Covid-19 and found to be positive?        Pt made aware to check in on theth floor and that care partner may wait in the car or come up to the lobby during the procedure but will need to provide their own mask.

## 2018-11-16 NOTE — Telephone Encounter (Signed)
Thanks for letting me know Rg

## 2018-11-16 NOTE — Telephone Encounter (Signed)
Hi Dr. Lyndel Safe, please disregard this message. Pt called her insurance and the person who she spoke with confirmed that her procedure will be covered so I put her back on your schedule for tomorrow. Thank you.

## 2018-11-17 ENCOUNTER — Encounter: Payer: Self-pay | Admitting: Gastroenterology

## 2018-11-17 ENCOUNTER — Other Ambulatory Visit: Payer: Self-pay

## 2018-11-17 ENCOUNTER — Ambulatory Visit (AMBULATORY_SURGERY_CENTER): Payer: BC Managed Care – PPO | Admitting: Gastroenterology

## 2018-11-17 VITALS — BP 119/87 | HR 61 | Temp 97.0°F | Resp 13 | Ht 66.0 in | Wt 150.0 lb

## 2018-11-17 DIAGNOSIS — Z8371 Family history of colonic polyps: Secondary | ICD-10-CM

## 2018-11-17 DIAGNOSIS — Z1211 Encounter for screening for malignant neoplasm of colon: Secondary | ICD-10-CM | POA: Diagnosis not present

## 2018-11-17 MED ORDER — SODIUM CHLORIDE 0.9 % IV SOLN: 500.0000 mL | Freq: Once | INTRAVENOUS | Status: DC

## 2018-11-17 NOTE — Progress Notes (Signed)
PT taken to PACU. Monitors in place. VSS. Report given to RN. 

## 2018-11-17 NOTE — Op Note (Signed)
[facility] Patient Name: Nicole Fields Procedure Date: 11/17/2018 10:43 AM MRN: [id number] Endoscopist: Jackquline Denmark , MD Age: 63 Referring MD:  Date of Birth: 04-Jul-1955 Gender: Female Account #: [id number] Procedure:                Colonoscopy Indications:              Colon cancer screening in patient at increased                            risk: Family history of colon polyps Medicines:                Monitored Anesthesia Care Procedure:                Pre-Anesthesia Assessment:                           - Prior to the procedure, a History and Physical                            was performed, and patient medications and                            allergies were reviewed. The patient's tolerance of                            previous anesthesia was also reviewed. The risks                            and benefits of the procedure and the sedation                            options and risks were discussed with the patient.                            All questions were answered, and informed consent                            was obtained. Prior Anticoagulants: The patient has                            taken no previous anticoagulant or antiplatelet                            agents. ASA Grade Assessment: II - A patient with                            mild systemic disease. After reviewing the risks                            and benefits, the patient was deemed in                            satisfactory condition to undergo the procedure.  After obtaining informed consent, the colonoscope                            was passed under direct vision. Throughout the                            procedure, the patient's blood pressure, pulse, and                            oxygen saturations were monitored continuously. The                            Colonoscope was introduced through the anus and                            advanced to the 2 cm into the  ileum. The                            colonoscopy was performed without difficulty. The                            patient tolerated the procedure well. The quality                            of the bowel preparation was good. The terminal                            ileum, ileocecal valve, appendiceal orifice, and                            rectum were photographed. Scope In: 10:58:39 AM Scope Out: 11:12:13 AM Scope Withdrawal Time: 0 hours 9 minutes 42 seconds  Total Procedure Duration: 0 hours 13 minutes 34 seconds  Findings:                 The colon (entire examined portion) appeared                            normal. The colon was redundant.                           Non-bleeding internal hemorrhoids were found during                            retroflexion. The hemorrhoids were small.                           The terminal ileum appeared normal.                           The exam was otherwise without abnormality on                            direct and retroflexion views. Complications:            No immediate complications.  Estimated Blood Loss:     Estimated blood loss: none. Impression:               -Small internal hemorrhoids.                           -Otherwise normal colonoscopy to TI. Recommendation:           - Patient has a contact number available for                            emergencies. The signs and symptoms of potential                            delayed complications were discussed with the                            patient. Return to normal activities tomorrow.                            Written discharge instructions were provided to the                            patient.                           - Resume previous diet.                           - Continue present medications.                           - Repeat colonoscopy in 10 years for screening                            purposes. Earlier, if with any new problems or if                             there is any change in family history.                           - Return to GI office PRN. Jackquline Denmark, MD 11/17/2018 11:16:44 AM This report has been signed electronically.

## 2018-11-17 NOTE — Patient Instructions (Signed)
Impression/Recommendations:  Hemorrhoid handout given to patient.  Resume previous diet. Continue present medications.  Repeat colonoscopy in 10 years for screening purposes.  Return to GI office as needed.  YOU HAD AN ENDOSCOPIC PROCEDURE TODAY AT [facility]:   Refer to the procedure report that was given to you for any specific questions about what was found during the examination.  If the procedure report does not answer your questions, please call your gastroenterologist to clarify.  If you requested that your care partner not be given the details of your procedure findings, then the procedure report has been included in a sealed envelope for you to review at your convenience later.  YOU SHOULD EXPECT: Some feelings of bloating in the abdomen. Passage of more gas than usual.  Walking can help get rid of the air that was put into your GI tract during the procedure and reduce the bloating. If you had a lower endoscopy (such as a colonoscopy or flexible sigmoidoscopy) you may notice spotting of blood in your stool or on the toilet paper. If you underwent a bowel prep for your procedure, you may not have a normal bowel movement for a few days.  Please Note:  You might notice some irritation and congestion in your nose or some drainage.  This is from the oxygen used during your procedure.  There is no need for concern and it should clear up in a day or so.  SYMPTOMS TO REPORT IMMEDIATELY:   Following lower endoscopy (colonoscopy or flexible sigmoidoscopy):  Excessive amounts of blood in the stool  Significant tenderness or worsening of abdominal pains  Swelling of the abdomen that is new, acute  Fever of 100F or higher For urgent or emergent issues, a gastroenterologist can be reached at any hour by calling [phone].   DIET:  We do recommend a small meal at first, but then you may proceed to your regular diet.  Drink plenty of fluids but you should avoid alcoholic  beverages for 24 hours.  ACTIVITY:  You should plan to take it easy for the rest of today and you should NOT DRIVE or use heavy machinery until tomorrow (because of the sedation medicines used during the test).    FOLLOW UP: Our staff will call the number listed on your records 48-72 hours following your procedure to check on you and address any questions or concerns that you may have regarding the information given to you following your procedure. If we do not reach you, we will leave a message.  We will attempt to reach you two times.  During this call, we will ask if you have developed any symptoms of COVID 19. If you develop any symptoms (ie: fever, flu-like symptoms, shortness of breath, cough etc.) before then, please call [phone].  If you test positive for Covid 19 in the 2 weeks post procedure, please call and report this information to [place].    If any biopsies were taken you will be contacted by phone or by letter within the next 1-3 weeks.  Please call us at [phone] if you have not heard about the biopsies in 3 weeks.    SIGNATURES/CONFIDENTIALITY: You and/or your care partner have signed paperwork which will be entered into your electronic medical record.  These signatures attest to the fact that that the information above on your After Visit Summary has been reviewed and is understood.  Full responsibility of the confidentiality of this discharge information lies with you and/or  your care-partner. 

## 2018-11-17 NOTE — Telephone Encounter (Signed)
Pt called back and answered "NO" to all screening questions.

## 2018-11-17 NOTE — Progress Notes (Signed)
Pt's states no medical or surgical changes since previsit or office visit.  Temp-April m   Vital signs-courtney washington

## 2018-11-19 ENCOUNTER — Telehealth: Payer: Self-pay | Admitting: *Deleted

## 2018-11-19 ENCOUNTER — Telehealth: Payer: Self-pay

## 2018-11-19 NOTE — Telephone Encounter (Signed)
  Follow up Call-  Call back number 11/17/2018  Post procedure Call Back phone  # [phone]  Permission to leave phone message Yes  Some recent data might be hidden     Patient questions:  Phone picked up and nothing.  Second call.

## 2018-11-19 NOTE — Telephone Encounter (Signed)
Attempted to reach pt. With follow-up call following endoscopic procedure 11/17/2018.  LM on pt. Voice mail.  Will try to reach pt. Again later today.

## 2018-11-23 ENCOUNTER — Telehealth: Payer: Self-pay | Admitting: *Deleted

## 2018-11-25 NOTE — Telephone Encounter (Signed)
Chart entered in error. SM 

## 2018-12-14 DIAGNOSIS — T63441A Toxic effect of venom of bees, accidental (unintentional), initial encounter: Secondary | ICD-10-CM | POA: Diagnosis not present

## 2018-12-14 DIAGNOSIS — M25522 Pain in left elbow: Secondary | ICD-10-CM | POA: Diagnosis not present

## 2019-09-14 ENCOUNTER — Encounter: Payer: BC Managed Care – PPO | Admitting: Obstetrics & Gynecology

## 2020-10-02 ENCOUNTER — Encounter: Payer: Self-pay | Admitting: Obstetrics & Gynecology

## 2020-10-02 ENCOUNTER — Other Ambulatory Visit: Payer: Self-pay

## 2020-10-02 ENCOUNTER — Ambulatory Visit (INDEPENDENT_AMBULATORY_CARE_PROVIDER_SITE_OTHER): Payer: 59 | Admitting: Obstetrics & Gynecology

## 2020-10-02 ENCOUNTER — Other Ambulatory Visit (HOSPITAL_COMMUNITY)
Admission: RE | Admit: 2020-10-02 | Discharge: 2020-10-02 | Disposition: A | Discharge status: Home / Self Care | Payer: 59 | Source: Ambulatory Visit | Attending: Obstetrics & Gynecology | Admitting: Obstetrics & Gynecology

## 2020-10-02 VITALS — BP 104/80 | HR 67 | Resp 16 | Ht 65.75 in | Wt 150.0 lb

## 2020-10-02 DIAGNOSIS — Z780 Asymptomatic menopausal state: Secondary | ICD-10-CM | POA: Diagnosis not present

## 2020-10-02 DIAGNOSIS — Z113 Encounter for screening for infections with a predominantly sexual mode of transmission: Secondary | ICD-10-CM | POA: Insufficient documentation

## 2020-10-02 DIAGNOSIS — Z01419 Encounter for gynecological examination (general) (routine) without abnormal findings: Secondary | ICD-10-CM | POA: Insufficient documentation

## 2020-10-02 NOTE — Progress Notes (Signed)
Nicole Fields January 05, 1956 [id number]   History:    65 y.o. G1P1L1 Married   RP:  Established patient presenting for annual gyn exam   HPI: Postmenopause, well on no HRT. No PMB.  No pelvic pain.  No pain with IC.  Normal vaginal secretions.  Would like STI screen.  Urine/BMs wnl.  Breasts normal.  BMI 24.4.  Fit/healthy nutrition.  cHTN on Losartan.  Health Labs with Fam MD.  Nicole Fields 2015.    Past medical history,surgical history, family history and social history were all reviewed and documented in the EPIC chart.  Gynecologic History No LMP recorded. Patient is postmenopausal.  Obstetric History OB History  Gravida Para Term Preterm AB Living  1 1     0 1  SAB IAB Ectopic Multiple Live Births      0        # Outcome Date GA Lbr Len/2nd Weight Sex Delivery Anes PTL Lv  1 Para              ROS: A ROS was performed and pertinent positives and negatives are included in the history.  GENERAL: No fevers or chills. HEENT: No change in vision, no earache, sore throat or sinus congestion. NECK: No pain or stiffness. CARDIOVASCULAR: No chest pain or pressure. No palpitations. PULMONARY: No shortness of breath, cough or wheeze. GASTROINTESTINAL: No abdominal pain, nausea, vomiting or diarrhea, melena or bright red blood per rectum. GENITOURINARY: No urinary frequency, urgency, hesitancy or dysuria. MUSCULOSKELETAL: No joint or muscle pain, no back pain, no recent trauma. DERMATOLOGIC: No rash, no itching, no lesions. ENDOCRINE: No polyuria, polydipsia, no heat or cold intolerance. No recent change in weight. HEMATOLOGICAL: No anemia or easy bruising or bleeding. NEUROLOGIC: No headache, seizures, numbness, tingling or weakness. PSYCHIATRIC: No depression, no loss of interest in normal activity or change in sleep pattern.     Exam:   BP 104/80   Pulse 67   Resp 16   Ht 5' 5.75" (1.67 m)   Wt 150 lb (68 kg)   BMI 24.40 kg/m   Body mass index is 24.4 kg/m.  General appearance :  Well developed well nourished female. No acute distress HEENT: Eyes: no retinal hemorrhage or exudates,  Neck supple, trachea midline, no carotid bruits, no thyroidmegaly Lungs: Clear to auscultation, no rhonchi or wheezes, or rib retractions  Heart: Regular rate and rhythm, no murmurs or gallops Breast:Examined in sitting and supine position were symmetrical in appearance, no palpable masses or tenderness,  no skin retraction, no nipple inversion, no nipple discharge, no skin discoloration, no axillary or supraclavicular lymphadenopathy Abdomen: no palpable masses or tenderness, no rebound or guarding Extremities: no edema or skin discoloration or tenderness  Pelvic: Vulva: Normal             Vagina: No gross lesions or discharge  Cervix: No gross lesions or discharge.  Pap reflex/Gono-Chlam done.  Uterus  AV, normal size, shape and consistency, non-tender and mobile  Adnexa  Without masses or tenderness  Anus: Normal   Assessment/Plan:  65 y.o. female for annual exam   1. Encounter for routine gynecological examination with Papanicolaou smear of cervix Normal gynecologic exam in Menopause.  Pap reflex done.  Breast exam normal.  Screening mammo Neg 01/2020.  Colono 2020.  Health labs with Fam MD.   - Cytology - PAP( [place])  2. Postmenopause Well on no HRT.  No PMB.  Vit D supplements, Ca++ total intake of 1.5 g/d, regular  weight bearing physical activities.  Will do BD next year.  3. Screen for STD (sexually transmitted disease)  - Cytology - PAP( [place]) with Gono-Chlam - HIV antibody (with reflex) - RPR - Hepatitis B Surface AntiGEN - Hepatitis C Antibody   Nicole Bruins MD, 4:42 PM 10/02/2020

## 2020-10-03 ENCOUNTER — Encounter: Payer: Self-pay | Admitting: Obstetrics & Gynecology

## 2020-10-03 LAB — CYTOLOGY - PAP
Chlamydia: NEGATIVE
Comment: NEGATIVE
Comment: NORMAL
Diagnosis: NEGATIVE
Neisseria Gonorrhea: NEGATIVE

## 2020-10-04 LAB — HEPATITIS B SURFACE ANTIGEN: Hepatitis B Surface Ag: NONREACTIVE

## 2020-10-04 LAB — HEPATITIS C ANTIBODY
Hepatitis C Ab: NONREACTIVE
SIGNAL TO CUT-OFF: 0.35 (ref <1.00)

## 2020-10-04 LAB — RPR: RPR Ser Ql: NONREACTIVE

## 2020-10-04 LAB — HIV ANTIBODY (ROUTINE TESTING W REFLEX): HIV 1&2 Ab, 4th Generation: NONREACTIVE

## 2020-12-28 DIAGNOSIS — E538 Deficiency of other specified B group vitamins: Secondary | ICD-10-CM | POA: Diagnosis not present

## 2020-12-28 DIAGNOSIS — E559 Vitamin D deficiency, unspecified: Secondary | ICD-10-CM | POA: Diagnosis not present

## 2020-12-28 DIAGNOSIS — Z1231 Encounter for screening mammogram for malignant neoplasm of breast: Secondary | ICD-10-CM | POA: Diagnosis not present

## 2020-12-28 DIAGNOSIS — Z0000 Encounter for general adult medical examination without abnormal findings: Secondary | ICD-10-CM | POA: Diagnosis not present

## 2020-12-28 DIAGNOSIS — Z9181 History of falling: Secondary | ICD-10-CM | POA: Diagnosis not present

## 2020-12-28 DIAGNOSIS — Z1331 Encounter for screening for depression: Secondary | ICD-10-CM | POA: Diagnosis not present

## 2020-12-28 DIAGNOSIS — Z23 Encounter for immunization: Secondary | ICD-10-CM | POA: Diagnosis not present

## 2020-12-28 DIAGNOSIS — Z6822 Body mass index (BMI) 22.0-22.9, adult: Secondary | ICD-10-CM | POA: Diagnosis not present

## 2021-03-01 DIAGNOSIS — H524 Presbyopia: Secondary | ICD-10-CM | POA: Diagnosis not present

## 2021-03-01 DIAGNOSIS — Z0100 Encounter for examination of eyes and vision without abnormal findings: Secondary | ICD-10-CM | POA: Diagnosis not present

## 2021-03-09 DIAGNOSIS — H40003 Preglaucoma, unspecified, bilateral: Secondary | ICD-10-CM | POA: Diagnosis not present

## 2021-05-01 DIAGNOSIS — H02832 Dermatochalasis of right lower eyelid: Secondary | ICD-10-CM | POA: Diagnosis not present

## 2021-05-01 DIAGNOSIS — H02834 Dermatochalasis of left upper eyelid: Secondary | ICD-10-CM | POA: Diagnosis not present

## 2021-05-01 DIAGNOSIS — H02835 Dermatochalasis of left lower eyelid: Secondary | ICD-10-CM | POA: Diagnosis not present

## 2021-05-01 DIAGNOSIS — H02831 Dermatochalasis of right upper eyelid: Secondary | ICD-10-CM | POA: Diagnosis not present

## 2021-06-04 IMAGING — MG MM DIGITAL SCREENING BILAT W/ TOMO W/ CAD
8 series · 8 of 24 positions shown · non-contrast
Comparison: Previous exam(s).

CLINICAL DATA: Screening.

EXAM:
DIGITAL SCREENING BILATERAL MAMMOGRAM WITH TOMO AND CAD

[L MLO synth-2D]
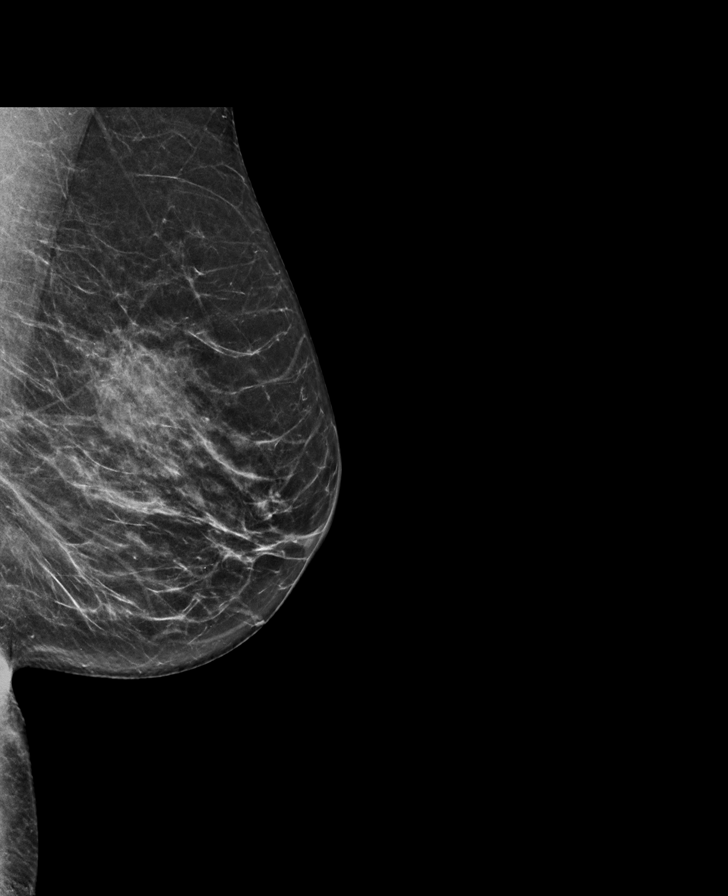

[R MLO synth-2D]
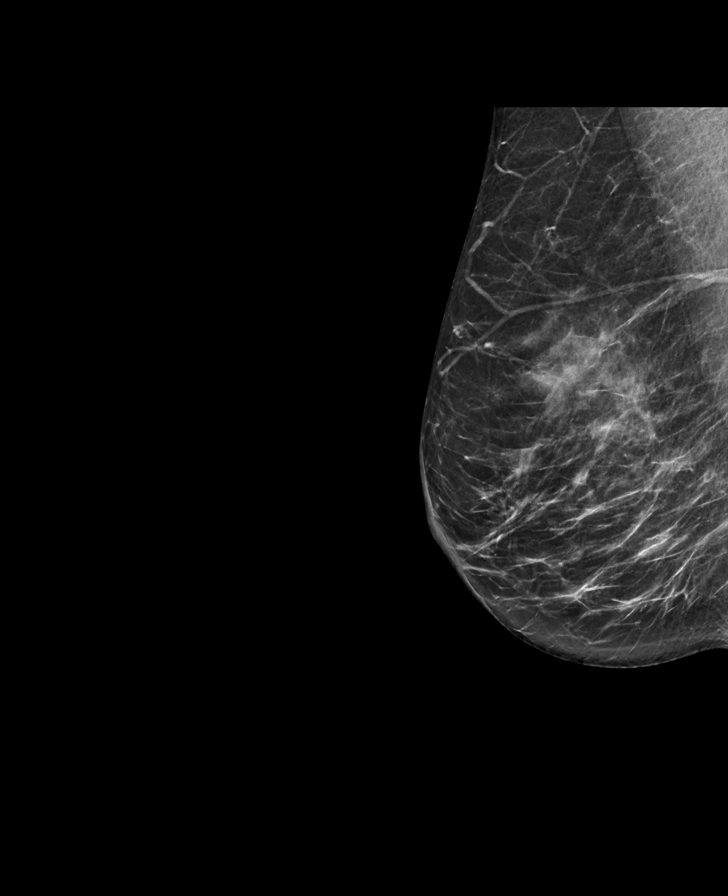

[L CC synth-2D]
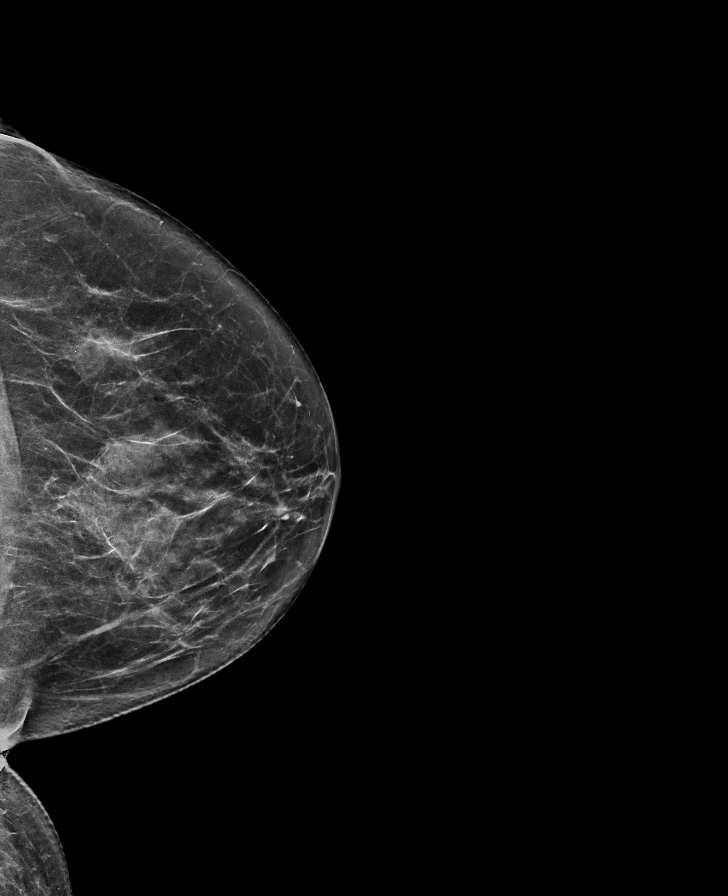

[R CC synth-2D]
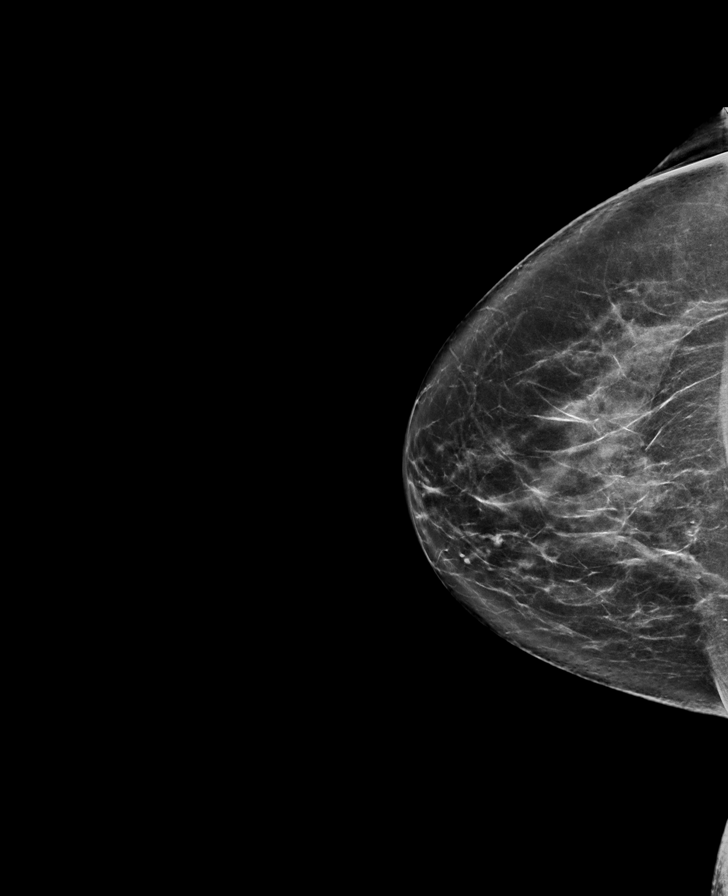

[R MLO tomo · tomo slice 31/61.0]
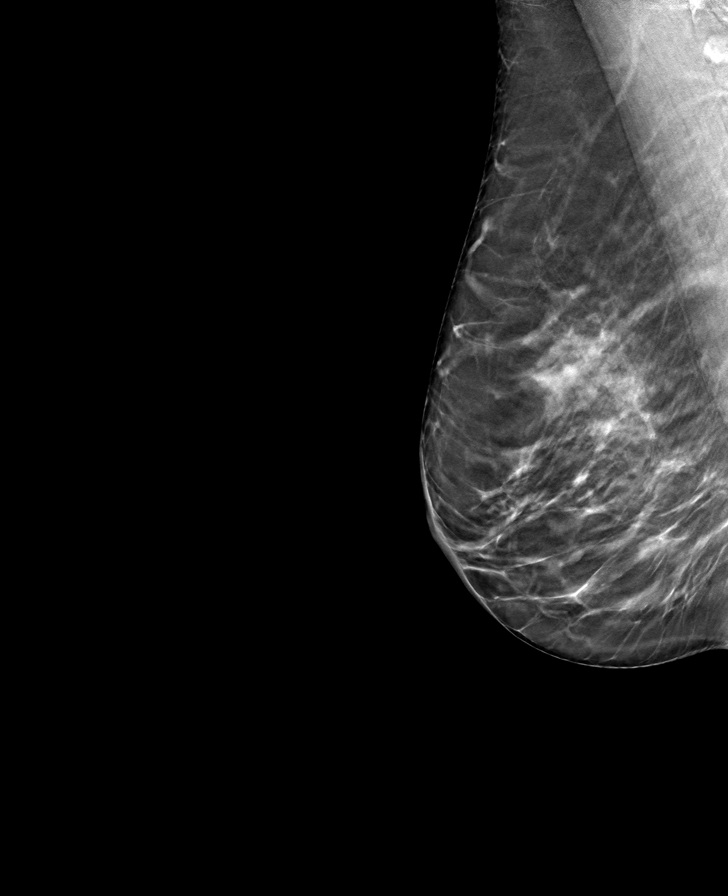

[L MLO tomo · tomo slice 31/60.0]
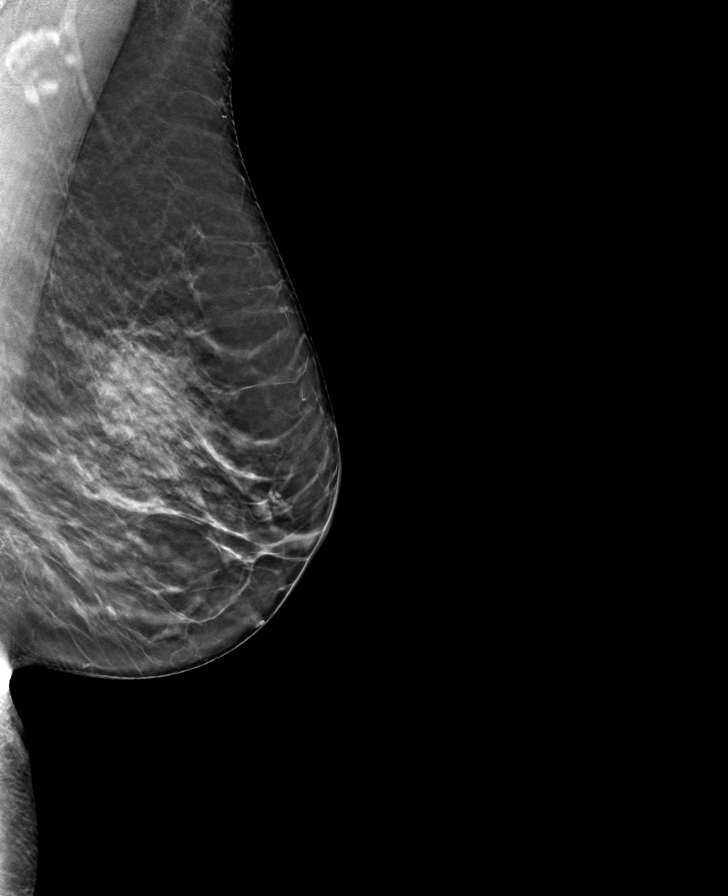

[R CC tomo · tomo slice 35/70.0]
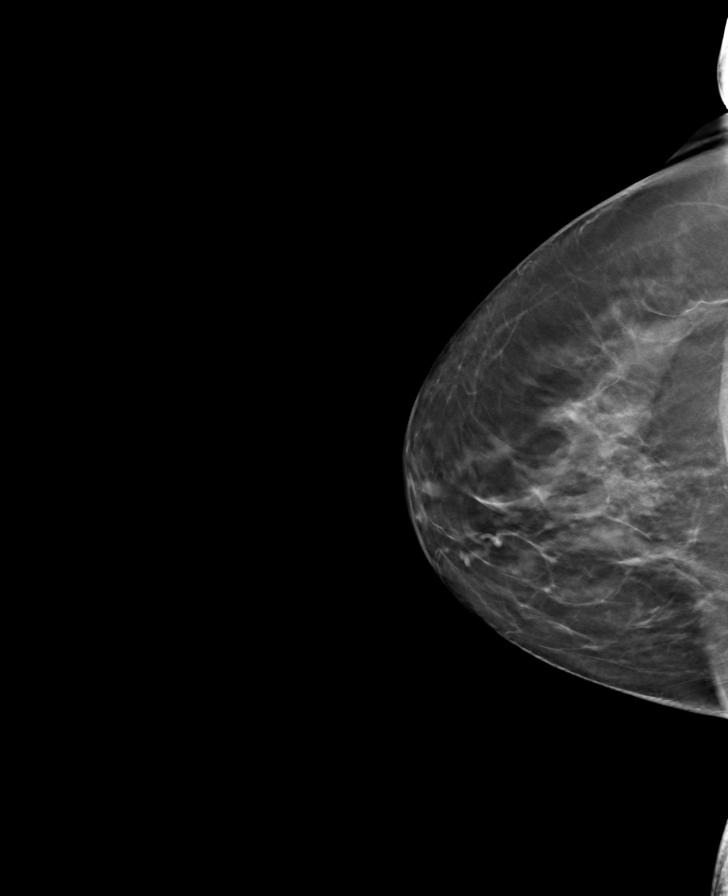

[L CC tomo · tomo slice 33/66.0]
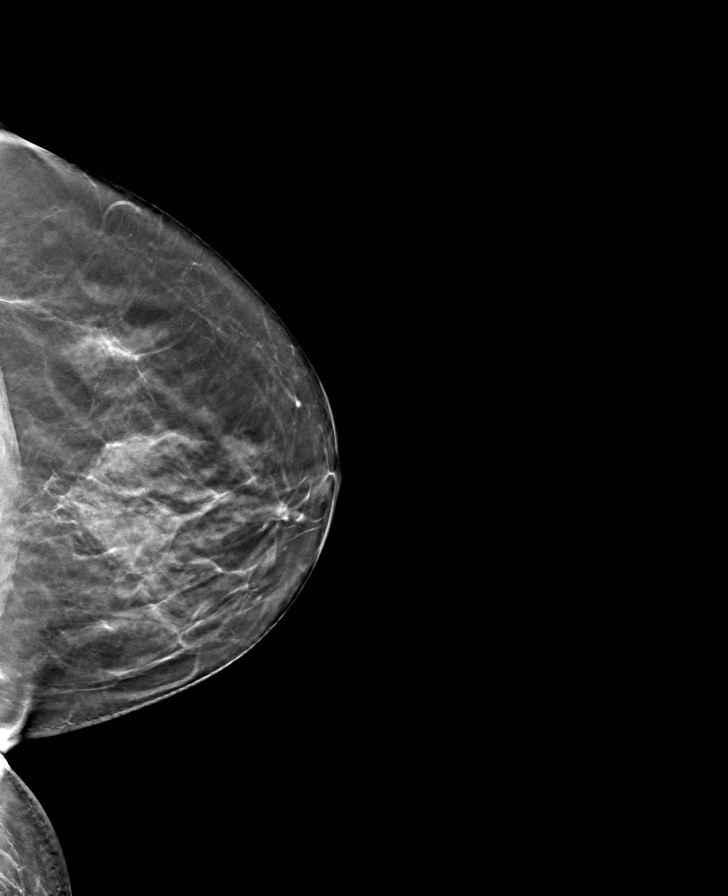

[8 of 24 positions shown; findings below may reference images not displayed]

ACR Breast Density Category c: The breast tissue is heterogeneously
dense, which may obscure small masses.
FINDINGS: There are no findings suspicious for malignancy. Images were
processed with CAD.
IMPRESSION: No mammographic evidence of malignancy. A result letter of this
screening mammogram will be mailed directly to the patient.

RECOMMENDATION:
Screening mammogram in one year. (Code:[id number]-[id number])

BI-RADS CATEGORY  1: Negative.

## 2021-07-12 DIAGNOSIS — Z6822 Body mass index (BMI) 22.0-22.9, adult: Secondary | ICD-10-CM | POA: Diagnosis not present

## 2021-07-12 DIAGNOSIS — Z79899 Other long term (current) drug therapy: Secondary | ICD-10-CM | POA: Diagnosis not present

## 2021-07-12 DIAGNOSIS — E559 Vitamin D deficiency, unspecified: Secondary | ICD-10-CM | POA: Diagnosis not present

## 2021-07-12 DIAGNOSIS — Z1231 Encounter for screening mammogram for malignant neoplasm of breast: Secondary | ICD-10-CM | POA: Diagnosis not present

## 2021-07-12 DIAGNOSIS — I10 Essential (primary) hypertension: Secondary | ICD-10-CM | POA: Diagnosis not present

## 2021-07-12 DIAGNOSIS — R69 Illness, unspecified: Secondary | ICD-10-CM | POA: Diagnosis not present

## 2021-07-12 DIAGNOSIS — E538 Deficiency of other specified B group vitamins: Secondary | ICD-10-CM | POA: Diagnosis not present

## 2021-07-12 DIAGNOSIS — E785 Hyperlipidemia, unspecified: Secondary | ICD-10-CM | POA: Diagnosis not present

## 2021-07-12 DIAGNOSIS — F5101 Primary insomnia: Secondary | ICD-10-CM | POA: Diagnosis not present

## 2021-08-15 DIAGNOSIS — Z411 Encounter for cosmetic surgery: Secondary | ICD-10-CM | POA: Diagnosis not present

## 2021-08-15 DIAGNOSIS — Z01818 Encounter for other preprocedural examination: Secondary | ICD-10-CM | POA: Diagnosis not present

## 2021-08-15 DIAGNOSIS — H02832 Dermatochalasis of right lower eyelid: Secondary | ICD-10-CM | POA: Diagnosis not present

## 2021-08-15 DIAGNOSIS — H02835 Dermatochalasis of left lower eyelid: Secondary | ICD-10-CM | POA: Diagnosis not present

## 2021-08-15 DIAGNOSIS — H02834 Dermatochalasis of left upper eyelid: Secondary | ICD-10-CM | POA: Diagnosis not present

## 2021-08-15 DIAGNOSIS — H02831 Dermatochalasis of right upper eyelid: Secondary | ICD-10-CM | POA: Diagnosis not present

## 2021-10-09 DIAGNOSIS — H40002 Preglaucoma, unspecified, left eye: Secondary | ICD-10-CM | POA: Diagnosis not present

## 2021-12-05 DIAGNOSIS — L820 Inflamed seborrheic keratosis: Secondary | ICD-10-CM | POA: Diagnosis not present

## 2021-12-05 DIAGNOSIS — D225 Melanocytic nevi of trunk: Secondary | ICD-10-CM | POA: Diagnosis not present

## 2021-12-05 DIAGNOSIS — L814 Other melanin hyperpigmentation: Secondary | ICD-10-CM | POA: Diagnosis not present

## 2021-12-05 DIAGNOSIS — L570 Actinic keratosis: Secondary | ICD-10-CM | POA: Diagnosis not present

## 2021-12-05 DIAGNOSIS — L578 Other skin changes due to chronic exposure to nonionizing radiation: Secondary | ICD-10-CM | POA: Diagnosis not present

## 2022-02-04 DIAGNOSIS — R059 Cough, unspecified: Secondary | ICD-10-CM | POA: Diagnosis not present

## 2022-02-04 DIAGNOSIS — Z833 Family history of diabetes mellitus: Secondary | ICD-10-CM | POA: Diagnosis not present

## 2022-02-04 DIAGNOSIS — E559 Vitamin D deficiency, unspecified: Secondary | ICD-10-CM | POA: Diagnosis not present

## 2022-02-04 DIAGNOSIS — Z139 Encounter for screening, unspecified: Secondary | ICD-10-CM | POA: Diagnosis not present

## 2022-02-04 DIAGNOSIS — H6591 Unspecified nonsuppurative otitis media, right ear: Secondary | ICD-10-CM | POA: Diagnosis not present

## 2022-02-04 DIAGNOSIS — Z131 Encounter for screening for diabetes mellitus: Secondary | ICD-10-CM | POA: Diagnosis not present

## 2022-02-04 DIAGNOSIS — E538 Deficiency of other specified B group vitamins: Secondary | ICD-10-CM | POA: Diagnosis not present

## 2022-02-04 DIAGNOSIS — E785 Hyperlipidemia, unspecified: Secondary | ICD-10-CM | POA: Diagnosis not present

## 2022-02-04 DIAGNOSIS — Z1331 Encounter for screening for depression: Secondary | ICD-10-CM | POA: Diagnosis not present

## 2022-02-04 DIAGNOSIS — R69 Illness, unspecified: Secondary | ICD-10-CM | POA: Diagnosis not present

## 2022-02-04 DIAGNOSIS — Z9181 History of falling: Secondary | ICD-10-CM | POA: Diagnosis not present

## 2022-02-04 DIAGNOSIS — Z6823 Body mass index (BMI) 23.0-23.9, adult: Secondary | ICD-10-CM | POA: Diagnosis not present

## 2022-02-04 DIAGNOSIS — F5101 Primary insomnia: Secondary | ICD-10-CM | POA: Diagnosis not present

## 2022-02-04 DIAGNOSIS — I10 Essential (primary) hypertension: Secondary | ICD-10-CM | POA: Diagnosis not present

## 2022-03-06 DIAGNOSIS — R928 Other abnormal and inconclusive findings on diagnostic imaging of breast: Secondary | ICD-10-CM | POA: Diagnosis not present

## 2022-03-06 DIAGNOSIS — N6312 Unspecified lump in the right breast, upper inner quadrant: Secondary | ICD-10-CM | POA: Diagnosis not present

## 2022-03-06 DIAGNOSIS — N6001 Solitary cyst of right breast: Secondary | ICD-10-CM | POA: Diagnosis not present

## 2022-04-26 DIAGNOSIS — L570 Actinic keratosis: Secondary | ICD-10-CM | POA: Diagnosis not present

## 2022-06-07 DIAGNOSIS — Z01818 Encounter for other preprocedural examination: Secondary | ICD-10-CM | POA: Diagnosis not present

## 2022-06-07 DIAGNOSIS — Z411 Encounter for cosmetic surgery: Secondary | ICD-10-CM | POA: Diagnosis not present

## 2022-07-10 DIAGNOSIS — J40 Bronchitis, not specified as acute or chronic: Secondary | ICD-10-CM | POA: Diagnosis not present

## 2022-07-10 DIAGNOSIS — Z6824 Body mass index (BMI) 24.0-24.9, adult: Secondary | ICD-10-CM | POA: Diagnosis not present

## 2022-07-10 DIAGNOSIS — J302 Other seasonal allergic rhinitis: Secondary | ICD-10-CM | POA: Diagnosis not present

## 2022-08-07 DIAGNOSIS — I10 Essential (primary) hypertension: Secondary | ICD-10-CM | POA: Diagnosis not present

## 2022-08-07 DIAGNOSIS — E785 Hyperlipidemia, unspecified: Secondary | ICD-10-CM | POA: Diagnosis not present

## 2022-08-07 DIAGNOSIS — F5101 Primary insomnia: Secondary | ICD-10-CM | POA: Diagnosis not present

## 2022-08-07 DIAGNOSIS — E538 Deficiency of other specified B group vitamins: Secondary | ICD-10-CM | POA: Diagnosis not present

## 2022-08-07 DIAGNOSIS — Z79899 Other long term (current) drug therapy: Secondary | ICD-10-CM | POA: Diagnosis not present

## 2022-08-07 DIAGNOSIS — Z6824 Body mass index (BMI) 24.0-24.9, adult: Secondary | ICD-10-CM | POA: Diagnosis not present

## 2022-08-08 DIAGNOSIS — R300 Dysuria: Secondary | ICD-10-CM | POA: Diagnosis not present

## 2022-08-08 DIAGNOSIS — R1031 Right lower quadrant pain: Secondary | ICD-10-CM | POA: Diagnosis not present

## 2022-08-08 DIAGNOSIS — R109 Unspecified abdominal pain: Secondary | ICD-10-CM | POA: Diagnosis not present

## 2022-08-08 DIAGNOSIS — R319 Hematuria, unspecified: Secondary | ICD-10-CM | POA: Diagnosis not present

## 2022-08-23 ENCOUNTER — Encounter: Payer: Self-pay | Admitting: Obstetrics & Gynecology

## 2022-08-23 ENCOUNTER — Other Ambulatory Visit (HOSPITAL_COMMUNITY)
Admission: RE | Admit: 2022-08-23 | Discharge: 2022-08-23 | Disposition: A | Discharge status: Home / Self Care | Payer: Medicare HMO | Source: Ambulatory Visit | Attending: Obstetrics & Gynecology | Admitting: Obstetrics & Gynecology

## 2022-08-23 ENCOUNTER — Ambulatory Visit (INDEPENDENT_AMBULATORY_CARE_PROVIDER_SITE_OTHER): Payer: Medicare HMO | Admitting: Obstetrics & Gynecology

## 2022-08-23 VITALS — BP 110/72 | HR 86 | Ht 65.25 in | Wt 151.0 lb

## 2022-08-23 DIAGNOSIS — Z01419 Encounter for gynecological examination (general) (routine) without abnormal findings: Secondary | ICD-10-CM | POA: Insufficient documentation

## 2022-08-23 DIAGNOSIS — Z780 Asymptomatic menopausal state: Secondary | ICD-10-CM

## 2022-08-23 NOTE — Progress Notes (Signed)
Nicole Fields 09-01-1955 [id number]   History:    67 y.o.  G1P1L1 Divorced   RP:  Established patient presenting for annual gyn exam   HPI: Postmenopause, well on no HRT. No PMB.  No pelvic pain.  Abstinent. Pap Neg 10/2020. Pap reflex today. Normal vaginal secretions. Urine/BMs wnl. Breasts normal. Mammo 02/2022 Lt Neg/Rt Breast US benign per patient, will obtain report. BMI 24.94. Fit/healthy nutrition. cHTN on Losartan.  Health Labs with Fam MD.  BD through Fam MD. Alen Bleacher 11/2018.    Past medical history,surgical history, family history and social history were all reviewed and documented in the EPIC chart.  Gynecologic History No LMP recorded. Patient is postmenopausal.  Obstetric History OB History  Gravida Para Term Preterm AB Living  1 1     0 1  SAB IAB Ectopic Multiple Live Births      0        # Outcome Date GA Lbr Len/2nd Weight Sex Delivery Anes PTL Lv  1 Para              ROS: A ROS was performed and pertinent positives and negatives are included in the history. GENERAL: No fevers or chills. HEENT: No change in vision, no earache, sore throat or sinus congestion. NECK: No pain or stiffness. CARDIOVASCULAR: No chest pain or pressure. No palpitations. PULMONARY: No shortness of breath, cough or wheeze. GASTROINTESTINAL: No abdominal pain, nausea, vomiting or diarrhea, melena or bright red blood per rectum. GENITOURINARY: No urinary frequency, urgency, hesitancy or dysuria. MUSCULOSKELETAL: No joint or muscle pain, no back pain, no recent trauma. DERMATOLOGIC: No rash, no itching, no lesions. ENDOCRINE: No polyuria, polydipsia, no heat or cold intolerance. No recent change in weight. HEMATOLOGICAL: No anemia or easy bruising or bleeding. NEUROLOGIC: No headache, seizures, numbness, tingling or weakness. PSYCHIATRIC: No depression, no loss of interest in normal activity or change in sleep pattern.     Exam:   BP 110/72   Pulse 86   Ht 5' 5.25" (1.657 m)   Wt 151 lb (68.5  kg)   SpO2 97%   BMI 24.94 kg/m   Body mass index is 24.94 kg/m.  General appearance : Well developed well nourished female. No acute distress HEENT: Eyes: no retinal hemorrhage or exudates,  Neck supple, trachea midline, no carotid bruits, no thyroidmegaly Lungs: Clear to auscultation, no rhonchi or wheezes, or rib retractions  Heart: Regular rate and rhythm, no murmurs or gallops Breast:Examined in sitting and supine position were symmetrical in appearance, no palpable masses or tenderness,  no skin retraction, no nipple inversion, no nipple discharge, no skin discoloration, no axillary or supraclavicular lymphadenopathy Abdomen: no palpable masses or tenderness, no rebound or guarding Extremities: no edema or skin discoloration or tenderness  Pelvic: Vulva: Normal             Vagina: No gross lesions or discharge  Cervix: No gross lesions or discharge.  Pap reflex done.  Uterus  AV, normal size, shape and consistency, non-tender and mobile  Adnexa  Without masses or tenderness  Anus: Normal   Assessment/Plan:  67 y.o. female for annual exam   1. Encounter for routine gynecological examination with Papanicolaou smear of cervix Postmenopause, well on no HRT. No PMB.  No pelvic pain.  Abstinent. Pap Neg 10/2020. Pap reflex today. Normal vaginal secretions. Urine/BMs wnl. Breasts normal. Mammo 02/2022 Lt Neg/Rt Breast US benign per patient, will obtain report. BMI 24.94. Fit/healthy nutrition. cHTN on Losartan.  Health Labs with  Fam MD.  BD through Fam MD. Alen Bleacher 11/2018. - Cytology - PAP( [place])  2. Postmenopause Postmenopause, well on no HRT. No PMB.  No pelvic pain.  Abstinent.  Other orders - tretinoin (RETIN-A) 0.05 % cream; Apply topically at bedtime. - tretinoin (RETIN-A) 0.1 % cream; Apply topically at bedtime. - Cyanocobalamin (B-12 PO); Take by mouth.   Genia Del MD, 3:44 PM

## 2022-08-26 DIAGNOSIS — R3129 Other microscopic hematuria: Secondary | ICD-10-CM | POA: Diagnosis not present

## 2022-08-26 DIAGNOSIS — N200 Calculus of kidney: Secondary | ICD-10-CM | POA: Diagnosis not present

## 2022-08-26 DIAGNOSIS — N2882 Megaloureter: Secondary | ICD-10-CM | POA: Diagnosis not present

## 2022-08-26 LAB — CYTOLOGY - PAP: Diagnosis: NEGATIVE

## 2022-09-11 ENCOUNTER — Telehealth: Payer: Self-pay

## 2022-09-11 NOTE — Telephone Encounter (Signed)
Patient left voicemail inquiring about pap results. Called patient and let her know that her pap from 08/23/22 was normal.

## 2022-09-30 DIAGNOSIS — N1330 Unspecified hydronephrosis: Secondary | ICD-10-CM | POA: Diagnosis not present

## 2022-09-30 DIAGNOSIS — N2882 Megaloureter: Secondary | ICD-10-CM | POA: Diagnosis not present

## 2022-09-30 DIAGNOSIS — N200 Calculus of kidney: Secondary | ICD-10-CM | POA: Diagnosis not present

## 2022-09-30 DIAGNOSIS — R3129 Other microscopic hematuria: Secondary | ICD-10-CM | POA: Diagnosis not present

## 2022-10-08 DIAGNOSIS — Z0100 Encounter for examination of eyes and vision without abnormal findings: Secondary | ICD-10-CM | POA: Diagnosis not present

## 2022-10-08 DIAGNOSIS — H524 Presbyopia: Secondary | ICD-10-CM | POA: Diagnosis not present

## 2023-01-01 DIAGNOSIS — Z0000 Encounter for general adult medical examination without abnormal findings: Secondary | ICD-10-CM | POA: Diagnosis not present

## 2023-01-01 DIAGNOSIS — Z139 Encounter for screening, unspecified: Secondary | ICD-10-CM | POA: Diagnosis not present

## 2023-01-01 DIAGNOSIS — Z9181 History of falling: Secondary | ICD-10-CM | POA: Diagnosis not present

## 2023-02-06 DIAGNOSIS — E785 Hyperlipidemia, unspecified: Secondary | ICD-10-CM | POA: Diagnosis not present

## 2023-02-06 DIAGNOSIS — M8589 Other specified disorders of bone density and structure, multiple sites: Secondary | ICD-10-CM | POA: Diagnosis not present

## 2023-02-06 DIAGNOSIS — E538 Deficiency of other specified B group vitamins: Secondary | ICD-10-CM | POA: Diagnosis not present

## 2023-02-06 DIAGNOSIS — F5101 Primary insomnia: Secondary | ICD-10-CM | POA: Diagnosis not present

## 2023-02-06 DIAGNOSIS — Z79899 Other long term (current) drug therapy: Secondary | ICD-10-CM | POA: Diagnosis not present

## 2023-02-06 DIAGNOSIS — E559 Vitamin D deficiency, unspecified: Secondary | ICD-10-CM | POA: Diagnosis not present

## 2023-02-06 DIAGNOSIS — I10 Essential (primary) hypertension: Secondary | ICD-10-CM | POA: Diagnosis not present

## 2023-04-09 DIAGNOSIS — M8589 Other specified disorders of bone density and structure, multiple sites: Secondary | ICD-10-CM | POA: Diagnosis not present

## 2023-04-09 DIAGNOSIS — Z13820 Encounter for screening for osteoporosis: Secondary | ICD-10-CM | POA: Diagnosis not present

## 2023-04-09 DIAGNOSIS — Z1231 Encounter for screening mammogram for malignant neoplasm of breast: Secondary | ICD-10-CM | POA: Diagnosis not present

## 2023-08-07 DIAGNOSIS — F5101 Primary insomnia: Secondary | ICD-10-CM | POA: Diagnosis not present

## 2023-08-07 DIAGNOSIS — E538 Deficiency of other specified B group vitamins: Secondary | ICD-10-CM | POA: Diagnosis not present

## 2023-08-07 DIAGNOSIS — Z79899 Other long term (current) drug therapy: Secondary | ICD-10-CM | POA: Diagnosis not present

## 2023-08-07 DIAGNOSIS — E785 Hyperlipidemia, unspecified: Secondary | ICD-10-CM | POA: Diagnosis not present

## 2023-08-07 DIAGNOSIS — I10 Essential (primary) hypertension: Secondary | ICD-10-CM | POA: Diagnosis not present

## 2023-08-07 DIAGNOSIS — Z6823 Body mass index (BMI) 23.0-23.9, adult: Secondary | ICD-10-CM | POA: Diagnosis not present

## 2023-08-07 DIAGNOSIS — E559 Vitamin D deficiency, unspecified: Secondary | ICD-10-CM | POA: Diagnosis not present

## 2023-08-14 DIAGNOSIS — Z6823 Body mass index (BMI) 23.0-23.9, adult: Secondary | ICD-10-CM | POA: Diagnosis not present

## 2023-08-14 DIAGNOSIS — H109 Unspecified conjunctivitis: Secondary | ICD-10-CM | POA: Diagnosis not present

## 2023-10-02 ENCOUNTER — Encounter (HOSPITAL_COMMUNITY): Payer: Self-pay | Admitting: Emergency Medicine

## 2023-10-02 ENCOUNTER — Other Ambulatory Visit: Payer: Self-pay

## 2023-10-02 ENCOUNTER — Emergency Department (HOSPITAL_COMMUNITY)
Admission: EM | Admit: 2023-10-02 | Discharge: 2023-10-02 | Disposition: A | Discharge status: Home / Self Care | Attending: Emergency Medicine | Admitting: Emergency Medicine

## 2023-10-02 DIAGNOSIS — I129 Hypertensive chronic kidney disease with stage 1 through stage 4 chronic kidney disease, or unspecified chronic kidney disease: Secondary | ICD-10-CM | POA: Diagnosis not present

## 2023-10-02 DIAGNOSIS — Z79899 Other long term (current) drug therapy: Secondary | ICD-10-CM | POA: Diagnosis not present

## 2023-10-02 DIAGNOSIS — M79605 Pain in left leg: Secondary | ICD-10-CM | POA: Insufficient documentation

## 2023-10-02 DIAGNOSIS — Z7982 Long term (current) use of aspirin: Secondary | ICD-10-CM | POA: Insufficient documentation

## 2023-10-02 DIAGNOSIS — N189 Chronic kidney disease, unspecified: Secondary | ICD-10-CM | POA: Insufficient documentation

## 2023-10-02 DIAGNOSIS — M79662 Pain in left lower leg: Secondary | ICD-10-CM | POA: Diagnosis not present

## 2023-10-02 MED ORDER — APIXABAN 2.5 MG PO TABS
10.0000 mg | ORAL_TABLET | Freq: Once | ORAL | Status: AC
  Administered 2023-10-02: 10 mg via ORAL
  Filled 2023-10-02: qty 4

## 2023-10-02 NOTE — ED Provider Notes (Incomplete)
 [place] EMERGENCY DEPARTMENT AT [facility] Provider Note   CSN: [id number] Arrival date & time: 10/02/23  8145     Patient presents with: Leg Pain   Nicole Fields is a 68 y.o. female who presents to the emergency department with a chief complaint of left lower leg pain behind her knee that started today.  Patient reports that it feels like there is almost a knot there and is concerned about a blood clot.  Patient denies any trauma or injury, denies strenuous activity.  Patient states that the area is painful, but only mildly so.  States that it is slightly worse when up and walking as opposed to sitting down and resting. Denies recent surgery, immobilization, history of blood clot, hormone replacement therapy, cancer diagnosis, or recent travel.  Denies fever, chills, chest pain, shortness of breath. Past medical history significant for osteopenia, hypertension, cataracts, anemia, vitamin D deficiency, CKD, insomnia, osteoporosis, arthritis.  Denies blood thinner outpatient.  {Add pertinent medical, surgical, social history, OB history to HPI:32947}  Leg Pain      Prior to Admission medications   Medication Sig Start Date End Date Taking? Authorizing Provider  aspirin 81 MG EC tablet Take 1 tablet by mouth daily. 09/07/17   [provider]  CALCIUM PO Take 1 tablet by mouth daily.    [provider]  Cholecalciferol (VITAMIN D PO) Take 1 tablet by mouth daily.    [provider]  clonazePAM (KLONOPIN) 1 MG tablet Take 1 mg by mouth 2 (two) times daily.    [provider]  Cyanocobalamin  (B-12 PO) Take by mouth.    [provider]  losartan (COZAAR) 50 MG tablet Take 25 mg by mouth daily.  08/11/17   [provider]  magnesium 30 MG tablet Take 30 mg by mouth 2 (two) times daily.    [provider]  Multiple Vitamin (MULTIVITAMIN) tablet Take 1 tablet by mouth daily.    [provider]  Omega-3 Fatty Acids  (FISH OIL PO) Take 1 tablet by mouth daily.    [provider]  tretinoin (RETIN-A) 0.05 % cream Apply topically at bedtime.    [provider]  tretinoin (RETIN-A) 0.1 % cream Apply topically at bedtime. 08/15/22   [provider]    Allergies: Patient has no known allergies.    Review of Systems  Musculoskeletal:        Left leg pain    Updated Vital Signs BP (!) 140/88 (BP Location: Right Arm)   Pulse 72   Temp 98.8 F (37.1 C) (Oral)   Resp 15   Ht 5' 2.25 (1.581 m)   Wt 68 kg   SpO2 98%   BMI 27.22 kg/m   Physical Exam Vitals and nursing note reviewed.  Constitutional:      General: She is awake. She is not in acute distress.    Appearance: Normal appearance. She is not ill-appearing, toxic-appearing or diaphoretic.  HENT:     Head: Normocephalic and atraumatic.  Eyes:     General: No scleral icterus. Cardiovascular:     Rate and Rhythm: Normal rate and regular rhythm.  Pulmonary:     Effort: Pulmonary effort is normal. No respiratory distress.     Breath sounds: No wheezing, rhonchi or rales.  Musculoskeletal:        General: Normal range of motion.     Right lower leg: No edema.     Left lower leg: No edema.  Comments: Roughly quarter sized area of swelling on posterior left leg above calf and below posterior knee area.  Area is soft but does appear swollen.  No fluctuance noted.  No sign of infection, no surrounding erythema.  Patient has varicose veins that are worse surrounding this area on her left leg when compared to her right leg.  No increased swelling when comparing left leg to right leg however.  No calf pain bilaterally.  Skin:    General: Skin is warm.     Capillary Refill: Capillary refill takes less than 2 seconds.  Neurological:     General: No focal deficit present.     Mental Status: She is alert and oriented to person, place, and time.  Psychiatric:        Mood and Affect: Mood normal.        Behavior: Behavior  normal. Behavior is cooperative.     (all labs ordered are listed, but only abnormal results are displayed) Labs Reviewed - No data to display  EKG: None  Radiology: No results found.  {Document cardiac monitor, telemetry assessment procedure when appropriate:32947} Procedures   Medications Ordered in the ED  apixaban  (ELIQUIS ) tablet 10 mg (10 mg Oral Given 10/02/23 2233)      {Click here for ABCD2, HEART and other calculators REFRESH Note before signing:1}                              Medical Decision Making Risk Prescription drug management.     Patient presents to the ED for concern of ***, this involves an extensive number of treatment options, and is a complaint that carries with it a high risk of complications and morbidity.  The differential diagnosis includes ***   Co morbidities that complicate the patient evaluation  ***   Additional history obtained:  Additional history obtained from *** {Blank multiple:19196::EMS,Family,Nursing,Outside Medical Records,Past Admission}   External records from outside source obtained and reviewed including ***   Lab Tests:  I Ordered, and personally interpreted labs.  The pertinent results include:  ***   Imaging Studies ordered:  I ordered imaging studies including ***  I independently visualized and interpreted imaging which showed *** I agree with the radiologist interpretation   Cardiac Monitoring:  The patient was maintained on a cardiac monitor.  I personally viewed and interpreted the cardiac monitored which showed an underlying rhythm of: ***   Medicines ordered and prescription drug management:  I ordered medication including ***  for ***  Reevaluation of the patient after these medicines showed that the patient {resolved/improved/worsened:23923::improved} I have reviewed the patients home medicines and have made adjustments as needed   Test Considered:  ***   Critical  Interventions:  ***   Consultations Obtained:  I requested consultation with the ***,  and discussed lab and imaging findings as well as pertinent plan - they recommend: ***   Problem List / ED Course:  ***   Reevaluation:  After the interventions noted above, I reevaluated the patient and found that they have :{resolved/improved/worsened:23923::improved}   Social Determinants of Health:  ***   Dispostion:  After consideration of the diagnostic results and the patients response to treatment, I feel that the patent would benefit from ***.    {Document critical care time when appropriate  Document review of labs and clinical decision tools ie CHADS2VASC2, etc  Document your independent review of radiology images and any outside records  Document  your discussion with family members, caretakers and with consultants  Document social determinants of health affecting pt's care  Document your decision making why or why not admission, treatments were needed:32947:::1}   Final diagnoses:  Left leg pain    ED Discharge Orders     None

## 2023-10-02 NOTE — ED Triage Notes (Signed)
 Pt reports left lower leg pain near knee starting today, reports knot and concerned for blood clot

## 2023-10-02 NOTE — Discharge Instructions (Addendum)
 It was a pleasure taking care of you today.  Based on your history, physical exam I feel you are safe for discharge.  Unfortunately it was past the hours of vascular ultrasound to have a DVT ultrasound completed.  Today you were given 1 dose of a blood thinning medication called Eliquis  in case you do have a blood clot.  You were offered an ultrasound in the morning, however it was decided that it would be best for you to see your family doctor.  Please call as soon as possible and make an appointment for tomorrow so that an ultrasound can be set up.  If you experience any of the following symptoms including but not limited to chest pain, shortness of breath, worsening calf pain, worsening leg swelling, worsening pain when walking, or other concerning symptoms please return to the emergency department or seek further medical care.

## 2023-10-02 NOTE — ED Provider Notes (Signed)
  EMERGENCY DEPARTMENT AT [facility] Provider Note   CSN: [id number] Arrival date & time: 10/02/23  8145     Patient presents with: Leg Pain   Nicole Fields is a 68 y.o. female who presents to the emergency department with a chief complaint of left lower leg pain behind her knee that started today.  Patient reports that it feels like there is almost a knot there and is concerned about a blood clot.  Patient denies any trauma or injury, denies strenuous activity.  Patient states that the area is painful, but only mildly so.  States that it is slightly worse when up and walking as opposed to sitting down and resting. Denies recent surgery, immobilization, history of blood clot, hormone replacement therapy, cancer diagnosis, or recent travel.  Denies fever, chills, chest pain, shortness of breath. Past medical history significant for osteopenia, hypertension, cataracts, anemia, vitamin D deficiency, CKD, insomnia, osteoporosis, arthritis.  Denies blood thinner outpatient.    Leg Pain      Prior to Admission medications   Medication Sig Start Date End Date Taking? Authorizing Provider  aspirin 81 MG EC tablet Take 1 tablet by mouth daily. 09/07/17   [provider]  CALCIUM PO Take 1 tablet by mouth daily.    [provider]  Cholecalciferol (VITAMIN D PO) Take 1 tablet by mouth daily.    [provider]  clonazePAM (KLONOPIN) 1 MG tablet Take 1 mg by mouth 2 (two) times daily.    [provider]  Cyanocobalamin  (B-12 PO) Take by mouth.    [provider]  losartan (COZAAR) 50 MG tablet Take 25 mg by mouth daily.  08/11/17   [provider]  magnesium 30 MG tablet Take 30 mg by mouth 2 (two) times daily.    [provider]  Multiple Vitamin (MULTIVITAMIN) tablet Take 1 tablet by mouth daily.    [provider]  Omega-3 Fatty Acids (FISH OIL PO) Take 1 tablet by mouth daily.    [provider]   tretinoin (RETIN-A) 0.05 % cream Apply topically at bedtime.    [provider]  tretinoin (RETIN-A) 0.1 % cream Apply topically at bedtime. 08/15/22   [provider]    Allergies: Patient has no known allergies.    Review of Systems  Musculoskeletal:        Left leg pain    Updated Vital Signs BP (!) 140/88 (BP Location: Right Arm)   Pulse 72   Temp 98.8 F (37.1 C) (Oral)   Resp 15   Ht 5' 2.25 (1.581 m)   Wt 68 kg   SpO2 98%   BMI 27.22 kg/m   Physical Exam Vitals and nursing note reviewed.  Constitutional:      General: She is awake. She is not in acute distress.    Appearance: Normal appearance. She is not ill-appearing, toxic-appearing or diaphoretic.  HENT:     Head: Normocephalic and atraumatic.  Eyes:     General: No scleral icterus. Cardiovascular:     Rate and Rhythm: Normal rate and regular rhythm.  Pulmonary:     Effort: Pulmonary effort is normal. No respiratory distress.     Breath sounds: No wheezing, rhonchi or rales.  Musculoskeletal:        General: Normal range of motion.     Right lower leg: No edema.     Left lower leg: No edema.     Comments: Roughly quarter sized area of swelling  on posterior left leg above calf and below posterior knee area.  Area is soft but does appear swollen.  No fluctuance noted.  No sign of infection, no surrounding erythema.  Patient has varicose veins that are worse surrounding this area on her left leg when compared to her right leg.  No increased swelling when comparing left leg to right leg however.  No calf pain bilaterally.  Skin:    General: Skin is warm.     Capillary Refill: Capillary refill takes less than 2 seconds.  Neurological:     General: No focal deficit present.     Mental Status: She is alert and oriented to person, place, and time.  Psychiatric:        Mood and Affect: Mood normal.        Behavior: Behavior normal. Behavior is cooperative.     (all labs ordered are listed,  but only abnormal results are displayed) Labs Reviewed - No data to display  EKG: None  Radiology: No results found.   Procedures   Medications Ordered in the ED  apixaban  (ELIQUIS ) tablet 10 mg (10 mg Oral Given 10/02/23 2233)                                    Medical Decision Making Risk Prescription drug management.   Patient presents to the ED for concern of left posterior leg pain, this involves an extensive number of treatment options, and is a complaint that carries with it a high risk of complications and morbidity.  The differential diagnosis includes DVT, trauma/injury, fracture, laceration, peripheral neuropathy, ischemic limb, etc.   Co morbidities that complicate the patient evaluation  osteopenia, hypertension, cataracts, anemia, vitamin D deficiency, CKD, insomnia, osteoporosis, arthritis   Medicines ordered and prescription drug management:  I ordered medication including Eliquis  for possible DVT prophylaxis Reevaluation of the patient after these medicines showed that the patient stayed the same I have reviewed the patients home medicines and have made adjustments as needed   Test Considered:  None   Critical Interventions:  None   Problem List / ED Course:  68 year old female, left posterior leg pain above calf, but below posterior knee, small quarter sized area that seems to be swollen, denies calf pain or swelling, no signs of infection or surrounding erythema, history of varicose veins, varicose veins are worse surrounding this area however no significant swelling abnormality when compared left to right Patient only in mild discomfort that is worse with movement, limited DVT risk factors, no history of prior blood clot Unfortunately patient came in after vascular ultrasound hours, patient educated about this and given prophylactic dose of Eliquis  here in the emergency department, I offered to set up the patient an ambulatory referral to  ultrasound for tomorrow morning, however patient declined and states that she has good follow-up with her primary care provider and that she would just make an appointment for tomorrow so an ultrasound can be set up closer to her home so she does not have to, low back to [place], I educated the patient that sometimes it takes longer to have a vascular study completed outpatient, patient is okay with this and would still just like to see her primary care provider tomorrow, I instructed the patient to make her primary care provider aware that I gave her 1 dose of Eliquis  and aware of her visit today. Unknown diagnosis at this time however  I do feel that it is necessary to rule out the possibility of a DVT based off of patient history and physical exam, prophylactic dose of anticoagulation given in the emergency department, patient will follow-up with primary care provider tomorrow and obtain outpatient ultrasound    Reevaluation:  After the interventions noted above, I reevaluated the patient and found that they have : Stayed the same   Social Determinants of Health:  None   Dispostion:  After consideration of the diagnostic results and the patients response to treatment, I feel that the patent would benefit from discharge and follow-up with primary care provider tomorrow, making them aware of today's visit, and getting set up for an ultrasound to rule out DVT.     Final diagnoses:  Left leg pain    ED Discharge Orders     None          Janetta Terrall FALCON, PA-C 10/03/23 0008    Franklyn Sid SAILOR, MD 10/03/23 [phone]

## 2023-10-06 DIAGNOSIS — M79662 Pain in left lower leg: Secondary | ICD-10-CM | POA: Diagnosis not present

## 2024-01-02 DIAGNOSIS — H26492 Other secondary cataract, left eye: Secondary | ICD-10-CM | POA: Diagnosis not present
